# Patient Record
Sex: Female | Born: 1970 | Race: White | Hispanic: No | Marital: Single | Smoking: Current every day smoker
Health system: Southern US, Community
[De-identification: ages and names within clinical notes are randomized; demographics above are authoritative.]

## PROBLEM LIST (undated history)

## (undated) DIAGNOSIS — E7800 Pure hypercholesterolemia, unspecified: Secondary | ICD-10-CM

## (undated) DIAGNOSIS — I10 Essential (primary) hypertension: Secondary | ICD-10-CM

## (undated) DIAGNOSIS — F419 Anxiety disorder, unspecified: Secondary | ICD-10-CM

## (undated) HISTORY — DX: Essential (primary) hypertension: I10

## (undated) HISTORY — DX: Anxiety disorder, unspecified: F41.9

## (undated) HISTORY — DX: Pure hypercholesterolemia, unspecified: E78.00

## (undated) HISTORY — PX: NO PAST SURGERIES: SHX2092

---

## 2006-09-22 DIAGNOSIS — J45909 Unspecified asthma, uncomplicated: Secondary | ICD-10-CM | POA: Insufficient documentation

## 2008-05-08 DIAGNOSIS — E785 Hyperlipidemia, unspecified: Secondary | ICD-10-CM | POA: Insufficient documentation

## 2010-12-23 ENCOUNTER — Ambulatory Visit: Payer: Self-pay | Admitting: Internal Medicine

## 2011-10-13 DIAGNOSIS — F17200 Nicotine dependence, unspecified, uncomplicated: Secondary | ICD-10-CM | POA: Insufficient documentation

## 2014-05-23 ENCOUNTER — Other Ambulatory Visit: Payer: Self-pay | Admitting: Endocrinology

## 2014-06-15 ENCOUNTER — Ambulatory Visit: Payer: Self-pay

## 2014-12-30 ENCOUNTER — Ambulatory Visit
Admit: 2014-12-30 | Disposition: A | Discharge status: Home / Self Care | Payer: Self-pay | Attending: Hematology and Oncology | Admitting: Hematology and Oncology

## 2015-01-06 ENCOUNTER — Telehealth: Payer: Self-pay | Admitting: Hematology and Oncology

## 2015-01-06 ENCOUNTER — Ambulatory Visit
Admit: 2015-01-06 | Disposition: A | Discharge status: Home / Self Care | Payer: Self-pay | Attending: Hematology and Oncology | Admitting: Hematology and Oncology

## 2015-01-06 LAB — CBC CANCER CENTER
Basophil #: 0.1 x10 3/mm (ref 0.0–0.1)
Basophil %: 1 %
Eosinophil #: 0.3 x10 3/mm (ref 0.0–0.7)
Eosinophil %: 2.8 %
HCT: 41.9 % (ref 35.0–47.0)
HGB: 14 g/dL (ref 12.0–16.0)
Lymphocyte #: 4.2 x10 3/mm — ABNORMAL HIGH (ref 1.0–3.6)
Lymphocyte %: 36.9 %
MCH: 30.3 pg (ref 26.0–34.0)
MCHC: 33.4 g/dL (ref 32.0–36.0)
MCV: 91 fL (ref 80–100)
Monocyte #: 0.6 x10 3/mm (ref 0.2–0.9)
Monocyte %: 5.3 %
Neutrophil #: 6.1 x10 3/mm (ref 1.4–6.5)
Neutrophil %: 54 %
Platelet: 279 x10 3/mm (ref 150–440)
RBC: 4.62 10*6/uL (ref 3.80–5.20)
RDW: 13.8 % (ref 11.5–14.5)
WBC: 11.2 x10 3/mm — ABNORMAL HIGH (ref 3.6–11.0)

## 2015-01-06 LAB — SEDIMENTATION RATE: Erythrocyte Sed Rate: 5 mm/hr (ref 0–20)

## 2015-01-28 NOTE — Telephone Encounter (Signed)
Epic requires text in order to cancel encounter.

## 2015-06-23 ENCOUNTER — Other Ambulatory Visit: Payer: Self-pay | Admitting: *Deleted

## 2015-06-23 ENCOUNTER — Other Ambulatory Visit: Payer: Self-pay | Admitting: Unknown Physician Specialty

## 2015-06-23 ENCOUNTER — Inpatient Hospital Stay
Admission: RE | Admit: 2015-06-23 | Discharge: 2015-06-23 | Disposition: A | Discharge status: Home / Self Care | Payer: Self-pay | Source: Ambulatory Visit | Attending: *Deleted | Admitting: *Deleted

## 2015-06-23 DIAGNOSIS — Z9289 Personal history of other medical treatment: Secondary | ICD-10-CM

## 2015-06-23 DIAGNOSIS — R928 Other abnormal and inconclusive findings on diagnostic imaging of breast: Secondary | ICD-10-CM

## 2015-07-06 ENCOUNTER — Ambulatory Visit
Admission: RE | Admit: 2015-07-06 | Discharge: 2015-07-06 | Disposition: A | Discharge status: Home / Self Care | Payer: PRIVATE HEALTH INSURANCE | Source: Ambulatory Visit | Attending: Unknown Physician Specialty | Admitting: Unknown Physician Specialty

## 2015-07-06 DIAGNOSIS — N6001 Solitary cyst of right breast: Secondary | ICD-10-CM | POA: Diagnosis not present

## 2015-07-06 DIAGNOSIS — R928 Other abnormal and inconclusive findings on diagnostic imaging of breast: Secondary | ICD-10-CM

## 2016-07-03 ENCOUNTER — Other Ambulatory Visit: Payer: Self-pay | Admitting: Obstetrics & Gynecology

## 2016-07-03 DIAGNOSIS — Z1231 Encounter for screening mammogram for malignant neoplasm of breast: Secondary | ICD-10-CM

## 2016-07-31 ENCOUNTER — Ambulatory Visit
Admission: RE | Admit: 2016-07-31 | Discharge: 2016-07-31 | Disposition: A | Discharge status: Home / Self Care | Payer: PRIVATE HEALTH INSURANCE | Source: Ambulatory Visit | Attending: Obstetrics & Gynecology | Admitting: Obstetrics & Gynecology

## 2016-07-31 DIAGNOSIS — Z1231 Encounter for screening mammogram for malignant neoplasm of breast: Secondary | ICD-10-CM | POA: Diagnosis present

## 2017-07-10 ENCOUNTER — Other Ambulatory Visit: Payer: Self-pay | Admitting: Family Medicine

## 2017-08-06 ENCOUNTER — Other Ambulatory Visit: Payer: Self-pay | Admitting: Nurse Practitioner

## 2017-08-06 DIAGNOSIS — Z1231 Encounter for screening mammogram for malignant neoplasm of breast: Secondary | ICD-10-CM

## 2017-08-31 ENCOUNTER — Ambulatory Visit
Admission: RE | Admit: 2017-08-31 | Discharge: 2017-08-31 | Disposition: A | Discharge status: Home / Self Care | Payer: PRIVATE HEALTH INSURANCE | Source: Ambulatory Visit | Attending: Nurse Practitioner | Admitting: Nurse Practitioner

## 2017-08-31 DIAGNOSIS — Z1231 Encounter for screening mammogram for malignant neoplasm of breast: Secondary | ICD-10-CM | POA: Diagnosis not present

## 2017-09-21 ENCOUNTER — Ambulatory Visit (INDEPENDENT_AMBULATORY_CARE_PROVIDER_SITE_OTHER): Payer: PRIVATE HEALTH INSURANCE | Admitting: Obstetrics and Gynecology

## 2017-09-21 ENCOUNTER — Encounter: Payer: Self-pay | Admitting: Obstetrics and Gynecology

## 2017-09-21 ENCOUNTER — Telehealth: Payer: Self-pay | Admitting: Obstetrics and Gynecology

## 2017-09-21 VITALS — BP 122/82 | HR 79 | Ht 70.0 in | Wt 191.0 lb

## 2017-09-21 DIAGNOSIS — Z01419 Encounter for gynecological examination (general) (routine) without abnormal findings: Secondary | ICD-10-CM

## 2017-09-21 DIAGNOSIS — Z1231 Encounter for screening mammogram for malignant neoplasm of breast: Secondary | ICD-10-CM | POA: Diagnosis not present

## 2017-09-21 DIAGNOSIS — Z1239 Encounter for other screening for malignant neoplasm of breast: Secondary | ICD-10-CM

## 2017-09-21 DIAGNOSIS — Z124 Encounter for screening for malignant neoplasm of cervix: Secondary | ICD-10-CM | POA: Diagnosis not present

## 2017-09-21 DIAGNOSIS — Z0000 Encounter for general adult medical examination without abnormal findings: Secondary | ICD-10-CM

## 2017-09-21 NOTE — Telephone Encounter (Signed)
2/15 at 1:50 with schuman for mirena

## 2017-09-21 NOTE — Progress Notes (Signed)
Gynecology Annual Exam  PCP: Fayrene HelperBoswell, Chelsa H, NP  Chief Complaint:  Chief Complaint  Patient presents with  . Gynecologic Exam    History of Present Illness: Patient is a 47 y.o. G3P0030 presents for annual exam. The patient has no complaints today.   LMP: No LMP recorded. Patient is not currently having periods (Reason: IUD). Average Interval: unknown Duration of flow: 0 days Heavy Menses: no Clots: no Intermenstrual Bleeding: no Postcoital Bleeding: no Dysmenorrhea: no   The patient is sexually active. She currently uses IUD for contraception. She denies dyspareunia.  The patient does not perform self breast exams.  There is no notable family history of breast or ovarian cancer in her family.  The patient wears seatbelts: yes.   The patient has regular exercise: not asked.    The patient denies current symptoms of depression.    Review of Systems: Review of Systems  Constitutional: Negative for chills, fever, malaise/fatigue and weight loss.  HENT: Negative for congestion, hearing loss and sinus pain.   Eyes: Negative for blurred vision and double vision.  Respiratory: Negative for cough, sputum production, shortness of breath and wheezing.   Cardiovascular: Negative for chest pain, palpitations, orthopnea and leg swelling.  Gastrointestinal: Negative for abdominal pain, constipation, diarrhea, nausea and vomiting.  Genitourinary: Negative for dysuria, flank pain, frequency, hematuria and urgency.  Musculoskeletal: Negative for back pain, falls and joint pain.  Skin: Negative for itching and rash.  Neurological: Negative for dizziness and headaches.  Psychiatric/Behavioral: Negative for depression, substance abuse and suicidal ideas. The patient is not nervous/anxious.     Past Medical History:  Past Medical History:  Diagnosis Date  . Anxiety   . Hypercholesterolemia   . Hypertension     Past Surgical History:  Past Surgical History:  Procedure Laterality  Date  . NO PAST SURGERIES      Gynecologic History:  No LMP recorded. Patient is not currently having periods (Reason: IUD). Contraception: IUD Last Pap: Results were: NIL 2016  Last mammogram: December 2018 Results were: [facility] Obstetric History: G3P0030  Family History:  Family History  Problem Relation Age of Onset  . Lung cancer Father [id number]  . Hypertension Mother   . Heart disease Maternal Grandmother     Social History:  Social History   Socioeconomic History  . Marital status: Single    Spouse name: Not on file  . Number of children: Not on file  . Years of education: Not on file  . Highest education level: Not on file  Social Needs  . Financial resource strain: Not on file  . Food insecurity - worry: Not on file  . Food insecurity - inability: Not on file  . Transportation needs - medical: Not on file  . Transportation needs - non-medical: Not on file  Occupational History  . Not on file  Tobacco Use  . Smoking status: Current Every Day Smoker    Packs/day: 1.00    Types: Cigarettes  . Smokeless tobacco: Never Used  Substance and Sexual Activity  . Alcohol use: Yes  . Drug use: No  . Sexual activity: Yes    Birth control/protection: IUD  Other Topics Concern  . Not on file  Social History Narrative  . Not on file    Allergies:  Allergies  Allergen Reactions  . Erythromycin Other (See Comments)    As a child, unsure reaction     Medications: Prior to Admission medications   Medication Sig Start Date  End Date Taking? Authorizing Provider  ALPRAZolam (XANAX) 0.25 MG tablet TK 1 T PO ONCE D PRA 09/17/17  Yes [provider]  levonorgestrel (MIRENA) 20 MCG/24HR IUD 1 each by Intrauterine route once.   Yes [provider]  lisinopril (PRINIVIL,ZESTRIL) 10 MG tablet TK 1 T PO ONCE A DAY 08/06/17  Yes [provider]  PROAIR HFA 108 (90 Base) MCG/ACT inhaler INL 1 PUFF PO Q 6 H PRF SOB OR WHZ 08/06/17  Yes [provider]  simvastatin (ZOCOR) 40 MG tablet TK 1 T PO HS 08/06/17  Yes [provider]    Physical Exam Vitals: Blood pressure 122/82, pulse 79, height 5\' 10"  (1.778 m), weight 191 lb (86.6 kg).  Physical Exam  Constitutional: She is oriented to person, place, and time. She appears well-developed.  Genitourinary: Vagina normal and uterus normal. There is no lesion on the right labia. There is no lesion on the left labia. Vagina exhibits no lesion. Right adnexum does not display mass and does not display fullness. Left adnexum does not display mass and does not display fullness.  Cervix exhibits visible IUD strings. Cervix does not exhibit motion tenderness.  Genitourinary Comments: Exam limited because of body habitus  HENT:  Head: Normocephalic and atraumatic.  Eyes: EOM are normal.  Neck: Neck supple. No thyromegaly present.  Cardiovascular: Normal rate, regular rhythm and normal heart sounds.  Pulmonary/Chest: Effort normal and breath sounds normal. Right breast exhibits no inverted nipple, no mass, no nipple discharge and no skin change. Left breast exhibits no inverted nipple, no mass, no nipple discharge and no skin change.  Abdominal: Soft. Bowel sounds are normal. She exhibits no distension and no mass.  Neurological: She is alert and oriented to person, place, and time.  Skin: Skin is warm and dry.  Psychiatric: She has a normal mood and affect. Her behavior is normal. Judgment and thought content normal.  Vitals reviewed.    Female chaperone present for pelvic and breast  portions of the physical exam  Assessment: 47 y.o. G3P0030 routine annual exam  Plan: Problem List Items Addressed This Visit    None    Visit Diagnoses    Screening for breast cancer    -  Primary   Relevant Orders   MM DIGITAL SCREENING BILATERAL   Screening for cervical cancer       Relevant Orders   PapIG, HPV, rfx 16/18   Health care maintenance       Relevant Orders   MM DIGITAL SCREENING  BILATERAL   PapIG, HPV, rfx 16/18      1) Mammogram - recommend yearly screening mammogram.  Mammogram Was ordered today  2) STI screening was offered and declined  3) ASCCP guidelines and rational discussed.  Patient opts for every 3 years screening interval  4) Contraception - Education given regarding options for contraception. Will have patient return for IUD replacement in 1 month. IUD inserted November 2018  5) Colonoscopy -- Screening recommended starting at age 72 for average risk individuals, age 60 for individuals deemed at increased risk (including African Americans) and recommended to continue until age 66.  For patient age 59-85 individualized approach is recommended.  Gold standard screening is via colonoscopy, Cologuard screening is an acceptable alternative for patient unwilling or unable to undergo colonoscopy.  "Colorectal cancer screening for average?risk adults: 2018 guideline update from the American Cancer Society"CA: A Cancer Journal for Clinicians: Feb 07, 2017 . Discussed with patient. She desires to start  screening at 50.  6) Routine healthcare maintenance including cholesterol, diabetes screening discussed managed by PCP  7) Encouraged smoking cessation  Adelene Idler MD  [facility] 09/21/17 2:13 PM

## 2017-09-25 LAB — PAPIG, HPV, RFX 16/18
HPV, high-risk: NEGATIVE
PAP Smear Comment: 0

## 2017-09-25 NOTE — Progress Notes (Signed)
NIL HPV negative, repeat pap in 5 years, called and left message to check mychart.

## 2017-10-26 ENCOUNTER — Ambulatory Visit (INDEPENDENT_AMBULATORY_CARE_PROVIDER_SITE_OTHER): Payer: PRIVATE HEALTH INSURANCE | Admitting: Obstetrics and Gynecology

## 2017-10-26 ENCOUNTER — Encounter: Payer: Self-pay | Admitting: Obstetrics and Gynecology

## 2017-10-26 VITALS — BP 140/90 | HR 82 | Ht 70.0 in | Wt 189.0 lb

## 2017-10-26 DIAGNOSIS — Z30431 Encounter for routine checking of intrauterine contraceptive device: Secondary | ICD-10-CM

## 2017-10-26 DIAGNOSIS — Z30433 Encounter for removal and reinsertion of intrauterine contraceptive device: Secondary | ICD-10-CM | POA: Diagnosis not present

## 2017-10-26 NOTE — Progress Notes (Signed)
  History of Present Illness:  Sara Cummings is a 47 y.o. that had a Mirena IUD placed approximately 5 years ago. Since that time, she states that she has been happy with this form of contraception and had no bleeding.  The following portions of the patient's history were reviewed and updated as appropriate: allergies, current medications, past family history, past medical history, past social history, past surgical history and problem list.  There are no active problems to display for this patient.  Medications:  Current Outpatient Medications on File Prior to Visit  Medication Sig Dispense Refill  . ALPRAZolam (XANAX) 0.25 MG tablet TK 1 T PO ONCE D PRA  2  . levonorgestrel (MIRENA) 20 MCG/24HR IUD 1 each by Intrauterine route once.    Marland Kitchen. lisinopril (PRINIVIL,ZESTRIL) 10 MG tablet TK 1 T PO ONCE A DAY  1  . PROAIR HFA 108 (90 Base) MCG/ACT inhaler INL 1 PUFF PO Q 6 H PRF SOB OR WHZ  1  . simvastatin (ZOCOR) 40 MG tablet TK 1 T PO HS  1   No current facility-administered medications on file prior to visit.    Allergies: is allergic to erythromycin.  Physical Exam:  BP 140/90   Pulse 82   Ht 5\' 10"  (1.778 m)   Wt 189 lb (85.7 kg)   BMI 27.12 kg/m  Body mass index is 27.12 kg/m. Constitutional: Well nourished, well developed female in no acute distress.  Abdomen: diffusely non tender to palpation, non distended, and no masses, hernias Neuro: Grossly intact Psych:  Normal mood and affect.    Pelvic exam:  Two IUD strings present seen coming from the cervical os. EGBUS, vaginal vault and cervix: within normal limits  IUD Removal Strings of IUD identified and grasped.  IUD removed without problem.  Pt tolerated this well.  IUD noted to be intact.  IUD PROCEDURE NOTE:  Sara Cummings is a 47 y.o. G3P0030 here for IUD insertion. No GYN concerns.  Last pap smear was normal.  IUD Insertion Procedure Note Patient identified, informed consent performed, consent signed.   Discussed  risks of irregular bleeding, cramping, infection, malpositioning or misplacement of the IUD outside the uterus which may require further procedure such as laparoscopy, risk of failure <1%. Time out was performed.  Urine pregnancy test negative.  A bimanual exam showed the uterus to be anteverted.  Speculum placed in the vagina.  Cervix visualized.  Cleaned with Betadine x 2.  Grasped anteriorly with a single tooth tenaculum.  Uterus sounded to 6 cm.   IUD placed per manufacturer's recommendations.  Strings trimmed to 3 cm. Tenaculum was removed, good hemostasis noted.  Patient tolerated procedure well.   Patient was given post-procedure instructions.  She was advised to have backup contraception for one week.  Patient was also asked to check IUD strings periodically and follow up in 4 weeks for IUD check.   Adelene Idlerhristanna Schuman MD [facility], [facility]. 10/26/17 2:18 PM

## 2017-11-06 NOTE — Telephone Encounter (Signed)
Mirena received 10/22/17

## 2018-07-19 ENCOUNTER — Other Ambulatory Visit: Payer: Self-pay | Admitting: Nurse Practitioner

## 2018-07-19 DIAGNOSIS — Z1231 Encounter for screening mammogram for malignant neoplasm of breast: Secondary | ICD-10-CM

## 2018-09-27 ENCOUNTER — Ambulatory Visit
Admission: RE | Admit: 2018-09-27 | Discharge: 2018-09-27 | Disposition: A | Discharge status: Home / Self Care | Payer: PRIVATE HEALTH INSURANCE | Source: Ambulatory Visit | Attending: Nurse Practitioner | Admitting: Nurse Practitioner

## 2018-09-27 DIAGNOSIS — Z1231 Encounter for screening mammogram for malignant neoplasm of breast: Secondary | ICD-10-CM | POA: Insufficient documentation

## 2018-10-02 ENCOUNTER — Other Ambulatory Visit: Payer: Self-pay | Admitting: Nurse Practitioner

## 2018-10-02 DIAGNOSIS — R928 Other abnormal and inconclusive findings on diagnostic imaging of breast: Secondary | ICD-10-CM

## 2018-10-02 DIAGNOSIS — R921 Mammographic calcification found on diagnostic imaging of breast: Secondary | ICD-10-CM

## 2018-10-07 ENCOUNTER — Ambulatory Visit
Admission: RE | Admit: 2018-10-07 | Discharge: 2018-10-07 | Disposition: A | Discharge status: Home / Self Care | Payer: PRIVATE HEALTH INSURANCE | Source: Ambulatory Visit | Attending: Nurse Practitioner | Admitting: Nurse Practitioner

## 2018-10-07 DIAGNOSIS — R921 Mammographic calcification found on diagnostic imaging of breast: Secondary | ICD-10-CM | POA: Diagnosis present

## 2018-10-07 DIAGNOSIS — R928 Other abnormal and inconclusive findings on diagnostic imaging of breast: Secondary | ICD-10-CM | POA: Diagnosis not present

## 2018-10-08 ENCOUNTER — Other Ambulatory Visit: Payer: Self-pay | Admitting: Nurse Practitioner

## 2018-10-08 DIAGNOSIS — R928 Other abnormal and inconclusive findings on diagnostic imaging of breast: Secondary | ICD-10-CM

## 2018-10-08 DIAGNOSIS — R921 Mammographic calcification found on diagnostic imaging of breast: Secondary | ICD-10-CM

## 2018-10-09 ENCOUNTER — Ambulatory Visit
Admission: RE | Admit: 2018-10-09 | Discharge: 2018-10-09 | Disposition: A | Discharge status: Home / Self Care | Payer: PRIVATE HEALTH INSURANCE | Source: Ambulatory Visit | Attending: Nurse Practitioner | Admitting: Nurse Practitioner

## 2018-10-09 DIAGNOSIS — R921 Mammographic calcification found on diagnostic imaging of breast: Secondary | ICD-10-CM

## 2018-10-09 DIAGNOSIS — R928 Other abnormal and inconclusive findings on diagnostic imaging of breast: Secondary | ICD-10-CM | POA: Insufficient documentation

## 2018-10-09 HISTORY — PX: BREAST BIOPSY: SHX20

## 2018-10-10 LAB — SURGICAL PATHOLOGY

## 2019-04-21 ENCOUNTER — Other Ambulatory Visit: Payer: Self-pay

## 2019-04-21 DIAGNOSIS — Z20822 Contact with and (suspected) exposure to covid-19: Secondary | ICD-10-CM

## 2019-04-22 LAB — NOVEL CORONAVIRUS, NAA: SARS-CoV-2, NAA: NOT DETECTED

## 2019-08-04 ENCOUNTER — Other Ambulatory Visit: Payer: Self-pay

## 2019-08-04 DIAGNOSIS — Z20822 Contact with and (suspected) exposure to covid-19: Secondary | ICD-10-CM

## 2019-08-06 LAB — NOVEL CORONAVIRUS, NAA: SARS-CoV-2, NAA: NOT DETECTED

## 2019-09-29 ENCOUNTER — Other Ambulatory Visit: Payer: Self-pay | Admitting: Nurse Practitioner

## 2019-09-29 DIAGNOSIS — Z1231 Encounter for screening mammogram for malignant neoplasm of breast: Secondary | ICD-10-CM

## 2020-11-08 IMAGING — MG MM CLIP PLACEMENT
4 series · 4 of 4 positions shown · non-contrast
Comparison: Previous exam(s).

CLINICAL DATA: Post stereotactic core needle biopsy of left breast
lower outer quadrant calcifications.

EXAM:
DIAGNOSTIC LEFT MAMMOGRAM POST STEREOTACTIC BIOPSY

[L XCCL (1 of 3)]
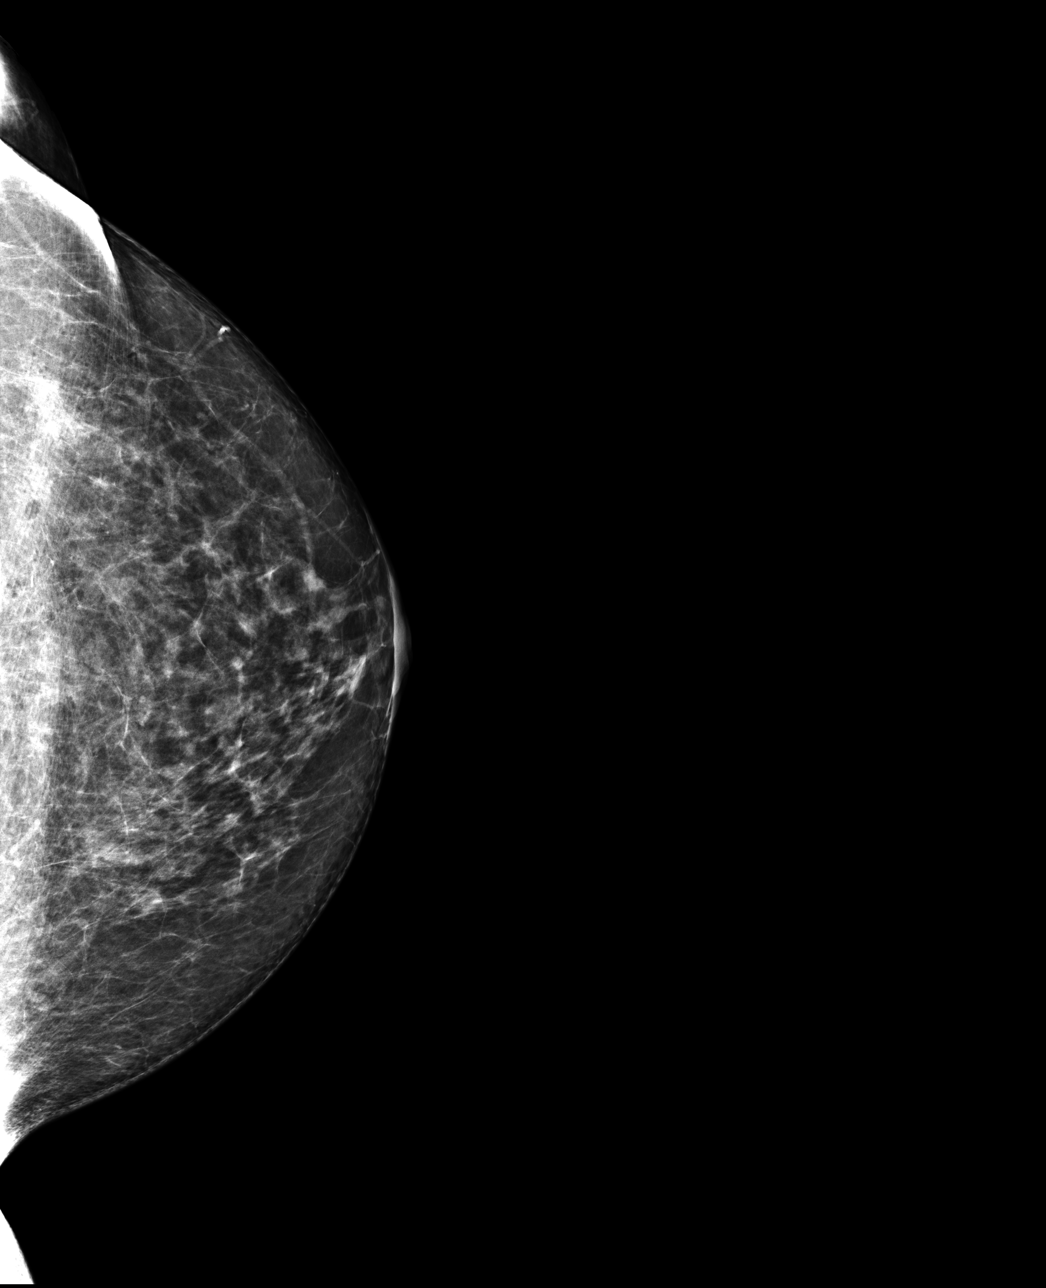

[L XCCL (2 of 3)]
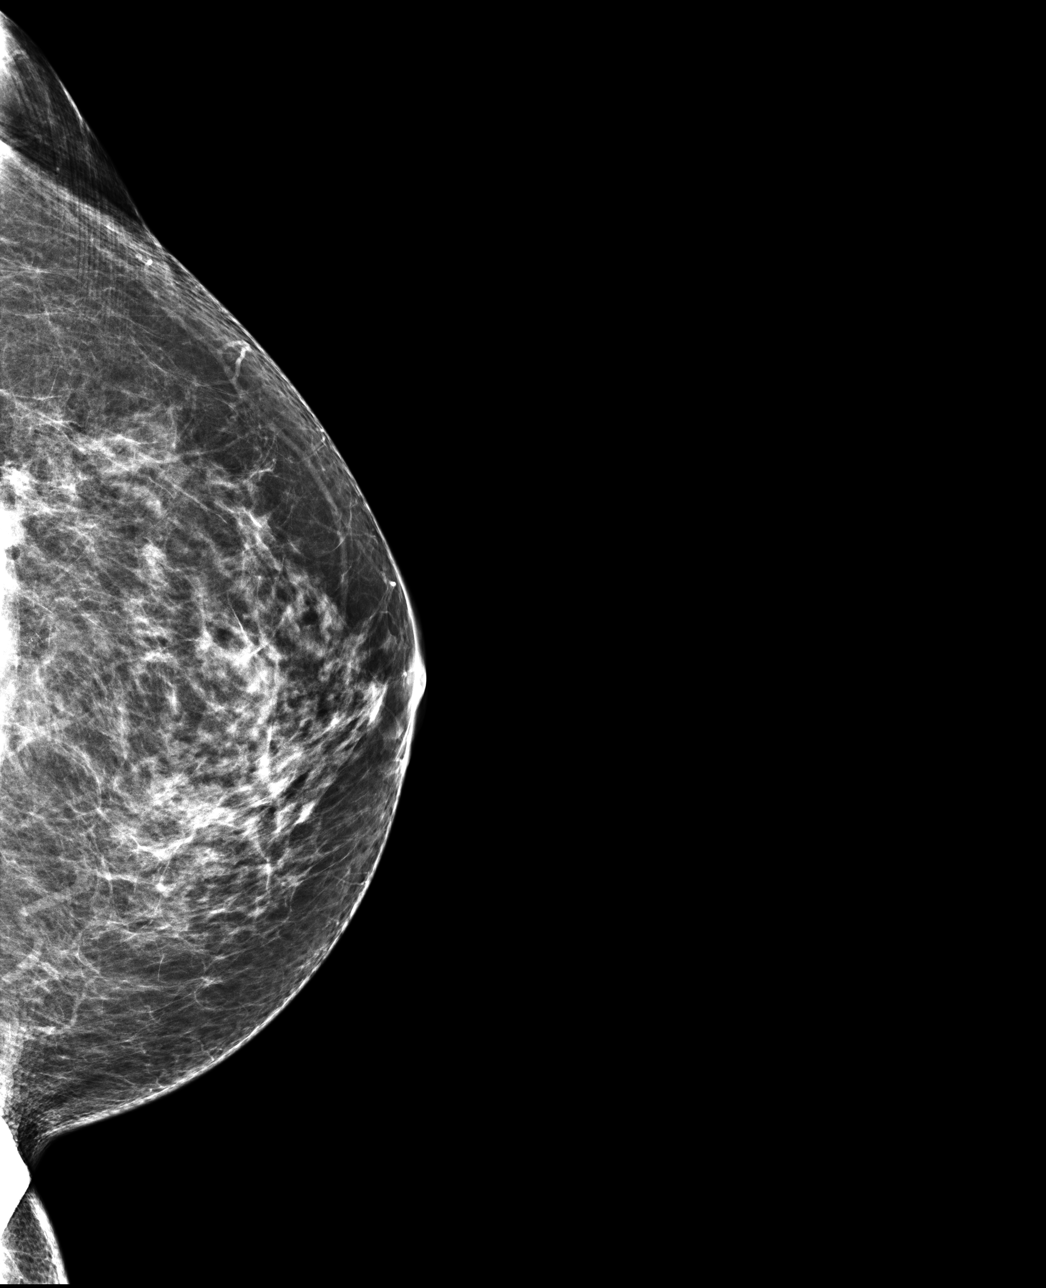

[L LM]
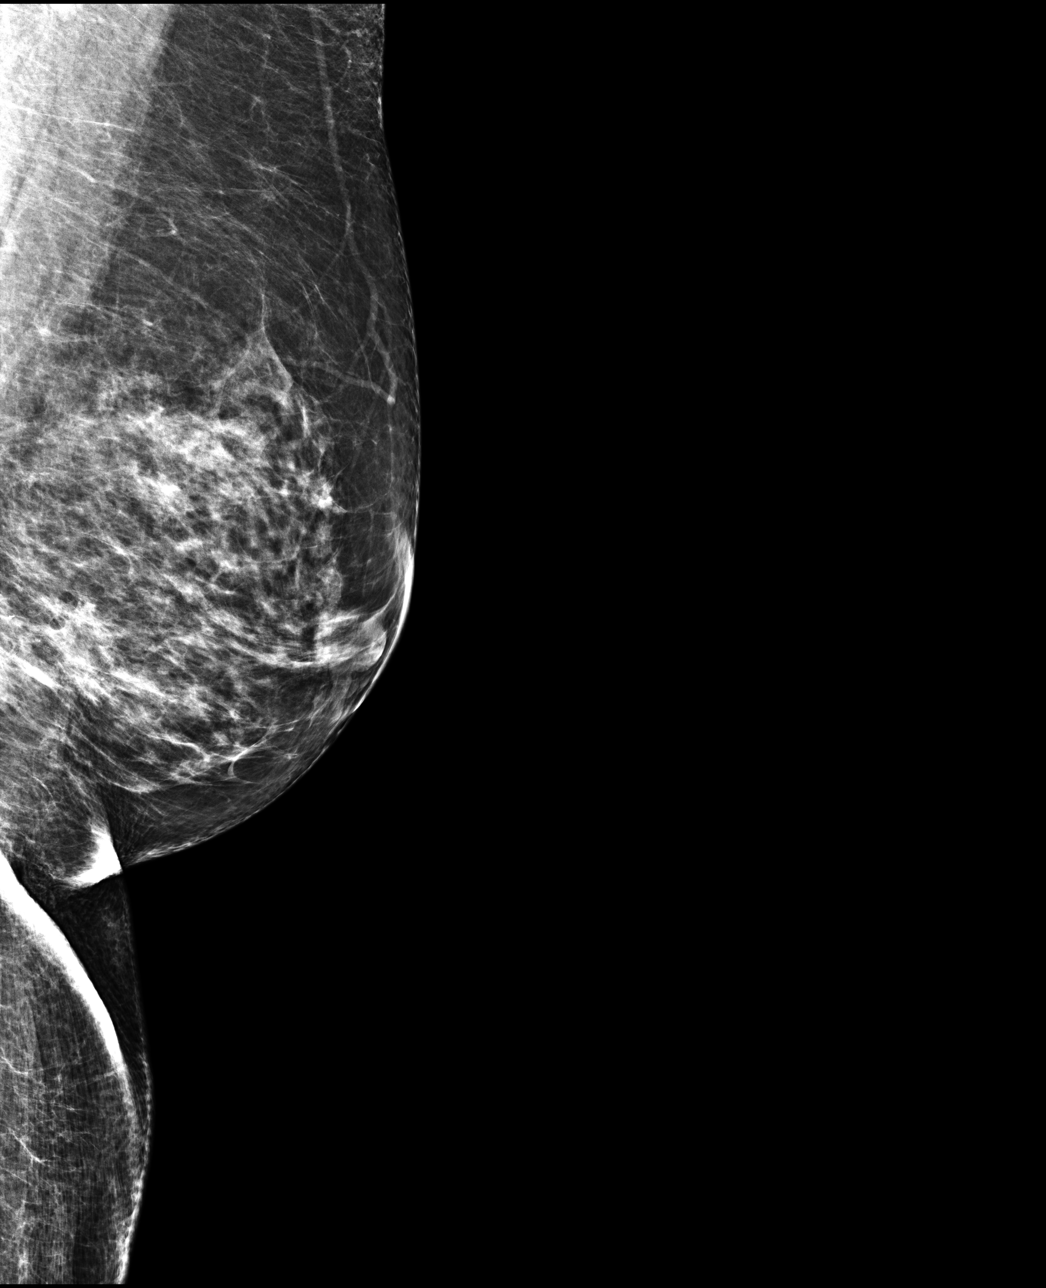

[L XCCL (3 of 3)]
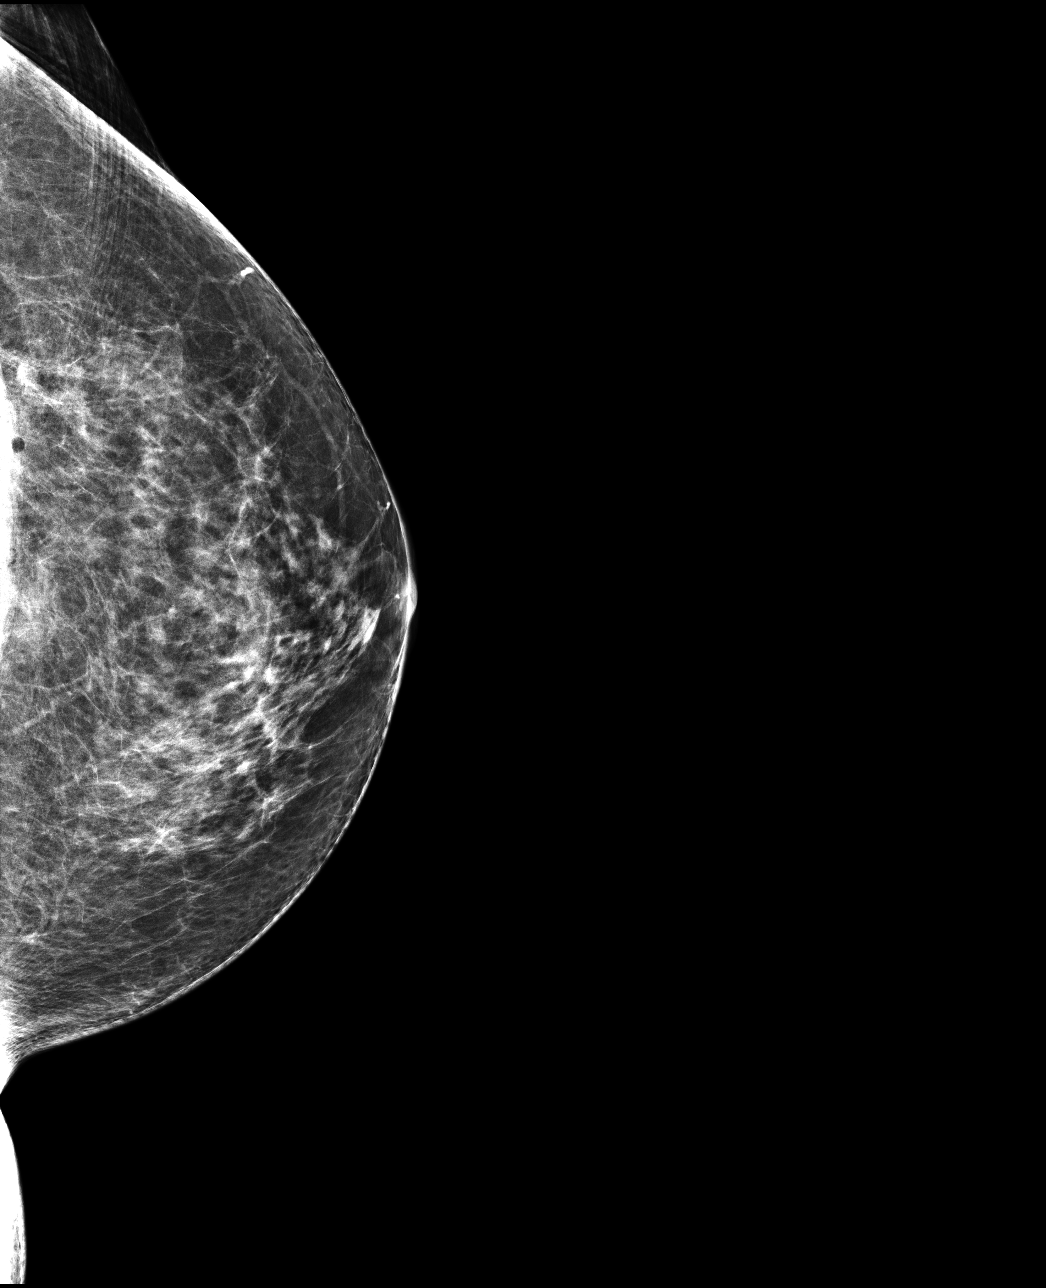

[4 of 4 positions shown; findings below may reference images not displayed]

FINDINGS: Mammographic images were obtained following stereotactic guided
biopsy of left breast calcifications. Two-view mammography
demonstrates presence of X shaped marker in the left breast lower
outer quadrant, in appropriate mammographic position.
IMPRESSION: Successful placement of X shaped marker, post stereotactic core
needle biopsy of the left breast.

Final Assessment: Post Procedure Mammograms for Marker Placement

## 2022-05-24 DIAGNOSIS — Z96641 Presence of right artificial hip joint: Secondary | ICD-10-CM | POA: Insufficient documentation

## 2022-11-02 ENCOUNTER — Ambulatory Visit: Payer: Self-pay | Admitting: *Deleted

## 2022-11-02 NOTE — Telephone Encounter (Signed)
Reason for Disposition  [id number] Systolic BP  >= [id number] OR Diastolic >= 80 AND [id number] taking BP medications    No interventions as she hung up on me.  Answer Assessment - Initial Assessment Questions 1. BLOOD PRESSURE: "What is the blood pressure?" "Did you take at least two measurements 5 minutes apart?"     For the last month I had headaches in middle of forehead above my nose.  I took lisinopril 40 mg.       I smoke.    I'm getting a headache .  138/113 yesterday,  140/100 earlier today, now 154/105  I don't want to have a stroke.   I've taken Goody Powders this morning too.      Nothing is swollen except my neck.   No leg swelling or hand swelling.     I go to [facility] for my PCP.        My BP is concerning me.    "I pulled up urgent care in [place] and got [place]"   I let her know we were not an urgent care but a primary care practice but if she would like I could schedule her for a new pt. Visit to get her established. 154/105 is my BP.    I'm just concerned.   I let her know that we weren't able to treat her without getting her established that we are not an urgent care.    I apologized that our name came up as an urgent care when she did an on line search for urgent cares in [place].   I again let her know I would be glad to set up an appt. But we were not able to do anything for her elevated BP today.    She had called her PCP "but they haven't called me back".  She hung up.    2. ONSET: "When did you take your blood pressure?"     *No Answer* 3. HOW: "How did you take your blood pressure?" (e.g., automatic home BP monitor, visiting nurse)     *No Answer* 4. HISTORY: "Do you have a history of high blood pressure?"     *No Answer* 5. MEDICINES: "Are you taking any medicines for blood pressure?" "Have you missed any doses recently?"     *No Answer* 6. OTHER SYMPTOMS: "Do you have any symptoms?" (e.g., blurred vision, chest pain, difficulty breathing, headache, weakness)      *No Answer* 7. PREGNANCY: "Is there any chance you are pregnant?" "When was your last menstrual period?"     *No Answer*  Protocols used: Blood Pressure - High-A-AH

## 2022-11-02 NOTE — Telephone Encounter (Signed)
  Chief Complaint: Concerned about elevated BP.    She had called her PCP but they had not returned her call.   She was looking for an urgent care and an on line search indicated [facility] was an urgent care.  She is not an established pt with [facility] Symptoms: Headaches Frequency: For the last month    Taking Lisinopril 40 mg. Pertinent Negatives: Patient denies Unable to determine. Disposition: []$ ED /[]$ Urgent Care (no appt availability in office) / [x]$ Appointment(In office/virtual)/ []$  [facility]/ []$ Home Care/ []$ Refused Recommended Disposition /[]$ [place] [facility]/ []$  Follow-up with PCP Additional Notes: She hung up shortly after I let her know that [facility] was not an urgent care.    She was talking fast and loud and saying she was concerned about her BP and didn't want to have a stroke.    She hung up before I could give her any advice or direction.   I did let her know to get in contact with her PCP since that was who was managing her BP when she first told me that she thought she was calling an urgent care center.

## 2022-11-03 ENCOUNTER — Telehealth: Payer: Self-pay

## 2022-11-03 NOTE — Telephone Encounter (Signed)
Sent message via portal

## 2022-11-03 NOTE — Telephone Encounter (Signed)
Patient LM asking for you to call her back states she only wanted to speak with you.

## 2022-11-20 ENCOUNTER — Other Ambulatory Visit: Payer: Self-pay

## 2022-11-20 DIAGNOSIS — E785 Hyperlipidemia, unspecified: Secondary | ICD-10-CM

## 2022-11-20 DIAGNOSIS — I10 Essential (primary) hypertension: Secondary | ICD-10-CM

## 2022-11-20 DIAGNOSIS — R7301 Impaired fasting glucose: Secondary | ICD-10-CM

## 2022-11-21 LAB — CBC WITH DIFFERENTIAL
Basophils Absolute: 0.1 10*3/uL (ref 0.0–0.2)
Basos: 1 %
EOS (ABSOLUTE): 0.3 10*3/uL (ref 0.0–0.4)
Eos: 3 %
Hematocrit: 41.6 % (ref 34.0–46.6)
Hemoglobin: 14.5 g/dL (ref 11.1–15.9)
Immature Grans (Abs): 0 10*3/uL (ref 0.0–0.1)
Immature Granulocytes: 0 %
Lymphocytes Absolute: 3.6 10*3/uL — ABNORMAL HIGH (ref 0.7–3.1)
Lymphs: 33 %
MCH: 36.7 pg — ABNORMAL HIGH (ref 26.6–33.0)
MCHC: 34.9 g/dL (ref 31.5–35.7)
MCV: 105 fL — ABNORMAL HIGH (ref 79–97)
Monocytes Absolute: 0.5 10*3/uL (ref 0.1–0.9)
Monocytes: 4 %
Neutrophils Absolute: 6.5 10*3/uL (ref 1.4–7.0)
Neutrophils: 59 %
RBC: 3.95 x10E6/uL (ref 3.77–5.28)
RDW: 13.5 % (ref 11.7–15.4)
WBC: 10.9 10*3/uL — ABNORMAL HIGH (ref 3.4–10.8)

## 2022-11-21 LAB — LIPID PANEL
Chol/HDL Ratio: 2.8 ratio (ref 0.0–4.4)
Cholesterol, Total: 175 mg/dL (ref 100–199)
HDL: 63 mg/dL (ref >39)
LDL Chol Calc (NIH): 95 mg/dL (ref 0–99)
Triglycerides: 92 mg/dL (ref 0–149)
VLDL Cholesterol Cal: 17 mg/dL (ref 5–40)

## 2022-11-21 LAB — CMP14+EGFR
ALT: 56 IU/L — ABNORMAL HIGH (ref 0–32)
AST: 106 IU/L — ABNORMAL HIGH (ref 0–40)
Albumin/Globulin Ratio: 1.5 (ref 1.2–2.2)
Albumin: 4 g/dL (ref 3.8–4.9)
Alkaline Phosphatase: 171 IU/L — ABNORMAL HIGH (ref 44–121)
BUN/Creatinine Ratio: 13 (ref 9–23)
BUN: 8 mg/dL (ref 6–24)
Bilirubin Total: 0.6 mg/dL (ref 0.0–1.2)
CO2: 22 mmol/L (ref 20–29)
Calcium: 9.5 mg/dL (ref 8.7–10.2)
Chloride: 101 mmol/L (ref 96–106)
Creatinine, Ser: 0.62 mg/dL (ref 0.57–1.00)
Globulin, Total: 2.6 g/dL (ref 1.5–4.5)
Glucose: 106 mg/dL — ABNORMAL HIGH (ref 70–99)
Potassium: 4 mmol/L (ref 3.5–5.2)
Sodium: 143 mmol/L (ref 134–144)
Total Protein: 6.6 g/dL (ref 6.0–8.5)
eGFR: 106 mL/min/{1.73_m2} (ref >59)

## 2022-11-21 LAB — HEMOGLOBIN A1C
Est. average glucose Bld gHb Est-mCnc: 117 mg/dL
Hgb A1c MFr Bld: 5.7 % — ABNORMAL HIGH (ref 4.8–5.6)

## 2022-11-21 LAB — TSH: TSH: 1.64 u[IU]/mL (ref 0.450–4.500)

## 2022-11-23 ENCOUNTER — Ambulatory Visit: Payer: Self-pay | Admitting: Nurse Practitioner

## 2022-11-23 VITALS — BP 124/92 | HR 101 | Ht 70.0 in | Wt 185.0 lb

## 2022-11-23 DIAGNOSIS — M542 Cervicalgia: Secondary | ICD-10-CM

## 2022-11-23 DIAGNOSIS — R591 Generalized enlarged lymph nodes: Secondary | ICD-10-CM | POA: Diagnosis not present

## 2022-11-23 DIAGNOSIS — I10 Essential (primary) hypertension: Secondary | ICD-10-CM | POA: Diagnosis not present

## 2022-11-23 NOTE — Progress Notes (Signed)
Established Patient Office Visit  Subjective:  Patient ID: Sara Cummings, female    DOB: 08/03/1971  Age: 52 y.o. MRN: [id number]  Chief Complaint  Patient presents with   Acute Visit    Swelling in neck and jaw line    Acute visit, 1 month and a half of edema and swelling at throat region near thyroid, and difficulty swallowing at times.       Past Medical History:  Diagnosis Date   Anxiety    Hypercholesterolemia    Hypertension     Past Surgical History:  Procedure Laterality Date   BREAST BIOPSY Left 10/09/2018   pending path   NO PAST SURGERIES      Social History   Socioeconomic History   Marital status: Single    Spouse name: Not on file   Number of children: Not on file   Years of education: Not on file   Highest education level: Not on file  Occupational History   Not on file  Tobacco Use   Smoking status: Every Day    Packs/day: 1    Types: Cigarettes   Smokeless tobacco: Never  Vaping Use   Vaping Use: Never used  Substance and Sexual Activity   Alcohol use: Yes   Drug use: No   Sexual activity: Yes    Birth control/protection: I.U.D.  Other Topics Concern   Not on file  Social History Narrative   Not on file   Social Determinants of Health   Financial Resource Strain: Not on file  Food Insecurity: Not on file  Transportation Needs: Not on file  Physical Activity: Inactive (09/21/2017)   Exercise Vital Sign    Days of Exercise per Week: 0 days    Minutes of Exercise per Session: 0 min  Stress: No Stress Concern Present (09/21/2017)   [place]    Feeling of Stress : Only a little  Social Connections: Moderately Isolated (09/21/2017)   Social Connection and Isolation Panel [NHANES]    Frequency of Communication with Friends and Family: Three times a week    Frequency of Social Gatherings with Friends and Family: More than three times a week    Attends Religious  Services: Never    Marine scientist or Organizations: No    Attends Archivist Meetings: Never    Marital Status: Never married  Intimate Partner Violence: Not At Risk (09/21/2017)   Humiliation, Afraid, Rape, and Kick questionnaire    Fear of Current or Ex-Partner: No    Emotionally Abused: No    Physically Abused: No    Sexually Abused: No    Family History  Problem Relation Age of Onset   Lung cancer Father 47   Hypertension Mother    Heart disease Maternal Grandmother     Allergies  Allergen Reactions   Erythromycin Other (See Comments)    As a child, unsure reaction     Review of Systems  Constitutional: Negative.   HENT: Negative.    Eyes: Negative.   Respiratory: Negative.    Cardiovascular: Negative.   Gastrointestinal: Negative.   Genitourinary: Negative.   Musculoskeletal:  Positive for joint pain.  Skin: Negative.   Neurological: Negative.   Endo/Heme/Allergies: Negative.   Psychiatric/Behavioral: Negative.         Objective:   BP (!) 124/92   Pulse (!) 101   Ht '5\' 10"'$  (1.778 m)   Wt 185 lb (83.9 kg)  SpO2 98%   BMI 26.54 kg/m   Vitals:   11/23/22 0903  BP: (!) 124/92  Pulse: (!) 101  Height: '5\' 10"'$  (1.778 m)  Weight: 185 lb (83.9 kg)  SpO2: 98%  BMI (Calculated): 26.54    Physical Exam Vitals reviewed.  Constitutional:      Appearance: Normal appearance.  HENT:     Head: Normocephalic.     Nose: Nose normal.  Eyes:     Pupils: Pupils are equal, round, and reactive to light.  Cardiovascular:     Rate and Rhythm: Normal rate and regular rhythm.  Pulmonary:     Effort: Pulmonary effort is normal.     Breath sounds: Normal breath sounds.  Abdominal:     General: Bowel sounds are normal.     Palpations: Abdomen is soft.  Musculoskeletal:     Cervical back: Tenderness present.  Skin:    General: Skin is warm and dry.  Neurological:     Mental Status: She is alert and oriented to person, place, and time.   Psychiatric:        Mood and Affect: Mood normal.        Behavior: Behavior normal.      No results found for any visits on 11/23/22.  Recent Results (from the past 2160 hour(s))  Hemoglobin A1c     Status: Abnormal   Collection Time: 11/20/22  9:42 AM  Result Value Ref Range   Hgb A1c MFr Bld 5.7 (H) 4.8 - 5.6 %    Comment:          Prediabetes: 5.7 - 6.4          Diabetes: >6.4          Glycemic control for adults with diabetes: <7.0    Est. average glucose Bld gHb Est-mCnc 117 mg/dL  TSH     Status: None   Collection Time: 11/20/22  9:42 AM  Result Value Ref Range   TSH 1.640 0.450 - 4.500 uIU/mL  CMP14+EGFR     Status: Abnormal   Collection Time: 11/20/22  9:42 AM  Result Value Ref Range   Glucose 106 (H) 70 - 99 mg/dL   BUN 8 6 - 24 mg/dL   Creatinine, Ser 0.62 0.57 - 1.00 mg/dL   eGFR 106 >59 mL/min/1.73   BUN/Creatinine Ratio 13 9 - 23   Sodium 143 134 - 144 mmol/L   Potassium 4.0 3.5 - 5.2 mmol/L   Chloride 101 96 - 106 mmol/L   CO2 22 20 - 29 mmol/L   Calcium 9.5 8.7 - 10.2 mg/dL   Total Protein 6.6 6.0 - 8.5 g/dL   Albumin 4.0 3.8 - 4.9 g/dL   Globulin, Total 2.6 1.5 - 4.5 g/dL   Albumin/Globulin Ratio 1.5 1.2 - 2.2   Bilirubin Total 0.6 0.0 - 1.2 mg/dL   Alkaline Phosphatase 171 (H) 44 - 121 IU/L   AST 106 (H) 0 - 40 IU/L   ALT 56 (H) 0 - 32 IU/L  Lipid panel     Status: None   Collection Time: 11/20/22  9:42 AM  Result Value Ref Range   Cholesterol, Total 175 100 - 199 mg/dL   Triglycerides 92 0 - 149 mg/dL   HDL 63 >39 mg/dL   VLDL Cholesterol Cal 17 5 - 40 mg/dL   LDL Chol Calc (NIH) 95 0 - 99 mg/dL   Chol/HDL Ratio 2.8 0.0 - 4.4 ratio    Comment:  T. Chol/HDL Ratio                                             Men  Women                               1/2 Avg.Risk  3.4    3.3                                   Avg.Risk  5.0    4.4                                2X Avg.Risk  9.6    7.1                                 3X Avg.Risk 23.4   11.0   CBC With Differential     Status: Abnormal   Collection Time: 11/20/22  9:42 AM  Result Value Ref Range   WBC 10.9 (H) 3.4 - 10.8 x10E3/uL   RBC 3.95 3.77 - 5.28 x10E6/uL   Hemoglobin 14.5 11.1 - 15.9 g/dL   Hematocrit 41.6 34.0 - 46.6 %   MCV 105 (H) 79 - 97 fL   MCH 36.7 (H) 26.6 - 33.0 pg   MCHC 34.9 31.5 - 35.7 g/dL   RDW 13.5 11.7 - 15.4 %   Neutrophils 59 Not Estab. %   Lymphs 33 Not Estab. %   Monocytes 4 Not Estab. %   Eos 3 Not Estab. %   Basos 1 Not Estab. %   Neutrophils Absolute 6.5 1.4 - 7.0 x10E3/uL   Lymphocytes Absolute 3.6 (H) 0.7 - 3.1 x10E3/uL   Monocytes Absolute 0.5 0.1 - 0.9 x10E3/uL   EOS (ABSOLUTE) 0.3 0.0 - 0.4 x10E3/uL   Basophils Absolute 0.1 0.0 - 0.2 x10E3/uL   Immature Granulocytes 0 Not Estab. %   Immature Grans (Abs) 0.0 0.0 - 0.1 x10E3/uL      Assessment & Plan:   Problem List Items Addressed This Visit       Cardiovascular and Mediastinum   Essential hypertension, benign   Relevant Medications   rosuvastatin (CRESTOR) 20 MG tablet     Other   Cervicalgia - Primary    No follow-ups on file.   Total time spent: 35 minutes  Evern Bio, NP  11/23/2022

## 2022-11-23 NOTE — Patient Instructions (Signed)
1) CT neck - edema for further eval 2) Will follow up with patient after results

## 2022-11-23 NOTE — Addendum Note (Signed)
Addended by: Evern Bio on: 11/23/2022 09:22 AM   Modules accepted: Orders

## 2023-01-12 ENCOUNTER — Ambulatory Visit: Payer: Self-pay | Admitting: Family Medicine

## 2023-02-24 ENCOUNTER — Other Ambulatory Visit: Payer: Self-pay | Admitting: Nurse Practitioner

## 2023-02-27 ENCOUNTER — Other Ambulatory Visit: Payer: Self-pay

## 2023-03-08 ENCOUNTER — Ambulatory Visit: Payer: Self-pay | Admitting: Family Medicine

## 2023-03-12 ENCOUNTER — Other Ambulatory Visit: Payer: Self-pay | Admitting: Nurse Practitioner

## 2023-03-13 ENCOUNTER — Encounter: Payer: Self-pay | Admitting: Emergency Medicine

## 2023-03-13 ENCOUNTER — Emergency Department: Payer: BLUE CROSS/BLUE SHIELD

## 2023-03-13 ENCOUNTER — Ambulatory Visit (INDEPENDENT_AMBULATORY_CARE_PROVIDER_SITE_OTHER): Payer: BLUE CROSS/BLUE SHIELD

## 2023-03-13 ENCOUNTER — Encounter: Payer: Self-pay | Admitting: *Deleted

## 2023-03-13 ENCOUNTER — Ambulatory Visit (INDEPENDENT_AMBULATORY_CARE_PROVIDER_SITE_OTHER)
Admission: EM | Admit: 2023-03-13 | Discharge: 2023-03-13 | Disposition: A | Discharge status: Other Acute Inpatient Hospital | Payer: BLUE CROSS/BLUE SHIELD | Source: Home / Self Care

## 2023-03-13 ENCOUNTER — Emergency Department
Admission: EM | Admit: 2023-03-13 | Discharge: 2023-03-13 | Disposition: A | Discharge status: Home / Self Care | Payer: BLUE CROSS/BLUE SHIELD | Attending: Emergency Medicine | Admitting: Emergency Medicine

## 2023-03-13 ENCOUNTER — Other Ambulatory Visit: Payer: Self-pay

## 2023-03-13 DIAGNOSIS — R0781 Pleurodynia: Secondary | ICD-10-CM | POA: Insufficient documentation

## 2023-03-13 DIAGNOSIS — I10 Essential (primary) hypertension: Secondary | ICD-10-CM | POA: Insufficient documentation

## 2023-03-13 DIAGNOSIS — J189 Pneumonia, unspecified organism: Secondary | ICD-10-CM

## 2023-03-13 DIAGNOSIS — D72825 Bandemia: Secondary | ICD-10-CM | POA: Insufficient documentation

## 2023-03-13 DIAGNOSIS — R109 Unspecified abdominal pain: Secondary | ICD-10-CM | POA: Insufficient documentation

## 2023-03-13 LAB — COMPREHENSIVE METABOLIC PANEL
ALT: 23 U/L (ref 0–44)
AST: 30 U/L (ref 15–41)
Albumin: 3.7 g/dL (ref 3.5–5.0)
Alkaline Phosphatase: 111 U/L (ref 38–126)
Anion gap: 11 (ref 5–15)
BUN: 12 mg/dL (ref 6–20)
CO2: 21 mmol/L — ABNORMAL LOW (ref 22–32)
Calcium: 8.7 mg/dL — ABNORMAL LOW (ref 8.9–10.3)
Chloride: 100 mmol/L (ref 98–111)
Creatinine, Ser: 0.57 mg/dL (ref 0.44–1.00)
GFR, Estimated: >60 mL/min (ref >60)
Glucose, Bld: 125 mg/dL — ABNORMAL HIGH (ref 70–99)
Potassium: 3.6 mmol/L (ref 3.5–5.1)
Sodium: 132 mmol/L — ABNORMAL LOW (ref 135–145)
Total Bilirubin: 1.2 mg/dL (ref 0.3–1.2)
Total Protein: 7.5 g/dL (ref 6.5–8.1)

## 2023-03-13 LAB — URINALYSIS, ROUTINE W REFLEX MICROSCOPIC
Bilirubin Urine: NEGATIVE
Glucose, UA: NEGATIVE mg/dL
Glucose, UA: NEGATIVE mg/dL
Hgb urine dipstick: NEGATIVE
Hgb urine dipstick: NEGATIVE
Ketones, ur: 20 mg/dL — AB
Leukocytes,Ua: NEGATIVE
Leukocytes,Ua: NEGATIVE
Nitrite: NEGATIVE
Nitrite: NEGATIVE
Protein, ur: 30 mg/dL — AB
Protein, ur: NEGATIVE mg/dL
Specific Gravity, Urine: >1.03 — ABNORMAL HIGH (ref 1.005–1.030)
Specific Gravity, Urine: 1.026 (ref 1.005–1.030)
pH: 5.5 (ref 5.0–8.0)
pH: 6 (ref 5.0–8.0)

## 2023-03-13 LAB — URINALYSIS, MICROSCOPIC (REFLEX)

## 2023-03-13 LAB — CBC WITH DIFFERENTIAL/PLATELET
Abs Immature Granulocytes: 0.11 10*3/uL — ABNORMAL HIGH (ref 0.00–0.07)
Basophils Absolute: 0.1 10*3/uL (ref 0.0–0.1)
Basophils Relative: 0 %
Eosinophils Absolute: 0.1 10*3/uL (ref 0.0–0.5)
Eosinophils Relative: 1 %
HCT: 39.8 % (ref 36.0–46.0)
Hemoglobin: 13.3 g/dL (ref 12.0–15.0)
Immature Granulocytes: 1 %
Lymphocytes Relative: 17 %
Lymphs Abs: 3.7 10*3/uL (ref 0.7–4.0)
MCH: 33.8 pg (ref 26.0–34.0)
MCHC: 33.4 g/dL (ref 30.0–36.0)
MCV: 101.3 fL — ABNORMAL HIGH (ref 80.0–100.0)
Monocytes Absolute: 1.2 10*3/uL — ABNORMAL HIGH (ref 0.1–1.0)
Monocytes Relative: 6 %
Neutro Abs: 16.6 10*3/uL — ABNORMAL HIGH (ref 1.7–7.7)
Neutrophils Relative %: 75 %
Platelets: 313 10*3/uL (ref 150–400)
RBC: 3.93 MIL/uL (ref 3.87–5.11)
RDW: 14.5 % (ref 11.5–15.5)
WBC: 21.8 10*3/uL — ABNORMAL HIGH (ref 4.0–10.5)
nRBC: 0 % (ref 0.0–0.2)

## 2023-03-13 LAB — LIPASE, BLOOD: Lipase: 28 U/L (ref 11–51)

## 2023-03-13 MED ORDER — DOXYCYCLINE HYCLATE 100 MG PO TABS
100.0000 mg | ORAL_TABLET | Freq: Once | ORAL | Status: AC
  Administered 2023-03-13: 100 mg via ORAL
  Filled 2023-03-13: qty 1

## 2023-03-13 MED ORDER — ONDANSETRON HCL 4 MG/2ML IJ SOLN
4.0000 mg | Freq: Once | INTRAMUSCULAR | Status: AC
  Administered 2023-03-13: 4 mg via INTRAVENOUS
  Filled 2023-03-13: qty 2

## 2023-03-13 MED ORDER — IOHEXOL 300 MG/ML  SOLN
100.0000 mL | Freq: Once | INTRAMUSCULAR | Status: AC | PRN
  Administered 2023-03-13: 100 mL via INTRAVENOUS

## 2023-03-13 MED ORDER — OXYCODONE-ACETAMINOPHEN 5-325 MG PO TABS: 1.0000 | ORAL_TABLET | ORAL | 0 refills | Status: DC | PRN

## 2023-03-13 MED ORDER — KETOROLAC TROMETHAMINE 30 MG/ML IJ SOLN
30.0000 mg | Freq: Once | INTRAMUSCULAR | Status: AC
  Administered 2023-03-13: 30 mg via INTRAMUSCULAR

## 2023-03-13 MED ORDER — SODIUM CHLORIDE 0.9 % IV SOLN: Freq: Once | INTRAVENOUS | Status: AC

## 2023-03-13 MED ORDER — OXYCODONE-ACETAMINOPHEN 5-325 MG PO TABS
1.0000 | ORAL_TABLET | Freq: Once | ORAL | Status: AC
  Administered 2023-03-13: 1 via ORAL
  Filled 2023-03-13: qty 1

## 2023-03-13 MED ORDER — MORPHINE SULFATE (PF) 4 MG/ML IV SOLN
4.0000 mg | Freq: Once | INTRAVENOUS | Status: AC
  Administered 2023-03-13: 4 mg via INTRAVENOUS
  Filled 2023-03-13: qty 1

## 2023-03-13 MED ORDER — DOXYCYCLINE HYCLATE 100 MG PO TABS: 100.0000 mg | ORAL_TABLET | Freq: Two times a day (BID) | ORAL | 0 refills | Status: DC

## 2023-03-13 MED ORDER — KETOROLAC TROMETHAMINE 30 MG/ML IJ SOLN
15.0000 mg | Freq: Once | INTRAMUSCULAR | Status: AC
  Administered 2023-03-13: 15 mg via INTRAVENOUS
  Filled 2023-03-13: qty 1

## 2023-03-13 NOTE — ED Provider Notes (Signed)
[facility] Provider Note    Event Date/Time   First MD Initiated Contact with Patient 03/13/23 1925     (approximate)   History   Flank Pain (R)   HPI  Sara Cummings is a 52 y.o. female with history of hypertension who presents with complaints of right flank pain, went to urgent care prior to arriving to the ED and was told that she had an elevated white blood cell count.  She reports the pain is moderate to severe, received IM Toradol shot which helped somewhat at urgent care.  No history of kidney stones.  No significant dysuria     Physical Exam   Triage Vital Signs: ED Triage Vitals  Enc Vitals Group     BP 03/13/23 1904 (!) 156/86     Pulse Rate 03/13/23 1904 (!) 109     Resp 03/13/23 1904 (!) 21     Temp 03/13/23 1904 98.7 F (37.1 C)     Temp Source 03/13/23 1904 Oral     SpO2 03/13/23 1904 100 %     Weight 03/13/23 1908 83.9 kg (185 lb)     Height 03/13/23 1908 1.778 m (5\' 10" )     Head Circumference --      Peak Flow --      Pain Score 03/13/23 1908 6     Pain Loc --      Pain Edu? --      Excl. in GC? --     Most recent vital signs: Vitals:   03/13/23 1904  BP: (!) 156/86  Pulse: (!) 109  Resp: (!) 21  Temp: 98.7 F (37.1 C)  SpO2: 100%     General: Awake, no distress.  CV:  Good peripheral perfusion.  Tachycardia Resp:  Normal effort.  Abd:  No distention.  Mild right CVA tenderness Other:     ED Results / Procedures / Treatments   Labs (all labs ordered are listed, but only abnormal results are displayed) Labs Reviewed  URINALYSIS, ROUTINE W REFLEX MICROSCOPIC     EKG     RADIOLOGY CT abdomen pelvis pending    PROCEDURES:  Critical Care performed:   Procedures   MEDICATIONS ORDERED IN ED: Medications  morphine (PF) 4 MG/ML injection 4 mg (has no administration in time range)  ondansetron (ZOFRAN) injection 4 mg (has no administration in time range)  0.9 %  sodium chloride infusion (has no  administration in time range)     IMPRESSION / MDM / ASSESSMENT AND PLAN / ED COURSE  I reviewed the triage vital signs and the nursing notes. Patient's presentation is most consistent with acute presentation with potential threat to life or bodily function.  Patient presents with right flank pain as detailed above, suspicious for ureterolithiasis.  Differential also includes pyelonephritis.  She does have mild tachycardia as well as an elevated white blood cell count but denies dysuria.  Will recheck urinalysis here given contaminated sample in urgent care.  Treat with morphine and Zofran, she is not driving  Have asked my colleague to follow-up on CT results and disposition accordingly        FINAL CLINICAL IMPRESSION(S) / ED DIAGNOSES   Final diagnoses:  Flank pain     Rx / DC Orders   ED Discharge Orders     None        Note:  This document was prepared using Dragon voice recognition software and may include unintentional dictation errors.   Saran Laviolette,  Molly Maduro, MD 03/13/23 Serena Croissant

## 2023-03-13 NOTE — ED Provider Notes (Signed)
  Physical Exam  BP (!) 156/86 (BP Location: Left Arm)   Pulse (!) 109   Temp 98.7 F (37.1 C) (Oral)   Resp (!) 21   Ht 5\' 10"  (1.778 m)   Wt 83.9 kg   SpO2 100%   BMI 26.54 kg/m   Physical Exam  Procedures  Procedures  ED Course / MDM    Medical Decision Making Amount and/or Complexity of Data Reviewed Labs: ordered. Radiology: ordered.  Risk Prescription drug management.   Assuming care of the patient from Dr. Cyril Loosen.  In short patient had elevated WBC at the urgent care along with flank pain and tachycardia so they sent her here to the ED.  Patient denies any abdominal pain at this time.  She was given Toradol at the urgent care.  CT of the abdomen/pelvis with IV contrast was independently reviewed and interpreted by me as being in negative for kidney stone or appendicitis.  No pyelonephritis.  UA is normal.  However the radiologist does indicate that there may be some pneumonia in the lower lung fields.  Will do a chest x-ray to assess  Chest x-ray independently reviewed interpreted by me as having a poorly defined infiltrate right lower lobe.  Confirmed by radiology  I did discuss these findings with patient.  She is beginning to have pain in the flank area again.  So I gave her Toradol 15 mg IV.  Percocet p.o.  Doxycycline p.o.  Patient was given a prescription for doxycycline and Percocet at discharge.  Strict instructions to return if worsening.  He was discharged in stable condition.      Faythe Ghee, PA-C 03/13/23 2131    Jene Every, MD 03/16/23 [phone]

## 2023-03-13 NOTE — ED Triage Notes (Signed)
Pt reports back pain that started this morning. Pt reports waking up with back pain. Back pain has increased thru out the day. No injury to cause pain.

## 2023-03-13 NOTE — Discharge Instructions (Signed)
Follow-up with your regular doctor.  Let them know that we diagnosed you with community-acquired pneumonia.  Take the doxycycline as prescribed.  You will need a repeat chest x-ray in about 3 weeks to ensure that the infection cleared.  Return to emergency department if you are worsening.  Take pain medication as needed.

## 2023-03-13 NOTE — ED Notes (Addendum)
Patient is being discharged from the Urgent Care and sent to the Emergency Department via POV . Per Nehemiah Settle PA, patient is in need of higher level of care due to R flank pain. Patient is aware and verbalizes understanding of plan of care.  Vitals:   03/13/23 1715  BP: 125/81  Pulse: 95  Resp: 18  Temp: 99.2 F (37.3 C)  SpO2: 97%

## 2023-03-13 NOTE — ED Provider Notes (Signed)
[facility]    CSN: [id number] Arrival date & time: 03/13/23  1659      History   Chief Complaint Chief Complaint  Patient presents with   Back Pain    HPI Sara Cummings is a 52 y.o. female presenting for right flank pain since earlier today which has gotten worse.  Pain does not radiate.  Pain is worse with lying flat.  She says she cannot get comfortable.  Pain is worse with taking a deep breath.  Denies fever, chills, cough, congestion, chest pain, abdominal pain, nausea/vomiting/diarrhea or constipation.  Denies urinary frequency/urgency, painful urination, hematuria.  No injury to her back.  No history of back problems.  Has taken over-the-counter Tylenol and NSAIDs without much improvement in symptoms.  No history of kidney stones.  No history of PE/DVT. No recent surgery or travel. No other complaints.  HPI  Past Medical History:  Diagnosis Date   Anxiety    Hypercholesterolemia    Hypertension     Patient Active Problem List   Diagnosis Date Noted   Cervicalgia 11/23/2022   Essential hypertension, benign 11/23/2022    Past Surgical History:  Procedure Laterality Date   BREAST BIOPSY Left 10/09/2018   pending path   NO PAST SURGERIES      OB History     Gravida  3   Para      Term      Preterm      AB  3   Living         SAB      IAB  3   Ectopic      Multiple      Live Births               Home Medications    Prior to Admission medications   Medication Sig Start Date End Date Taking? Authorizing Provider  ALPRAZolam (XANAX) 0.25 MG tablet TAKE 1 TABLET BY MOUTH EVERY DAY AS NEEDED FOR ANXIETY. DO NOT TAKE WITH OXYCODONE DUE TO RESPIRATORY DEPRESSION 03/13/23  Yes Miki Kins, FNP  levonorgestrel (MIRENA) 20 MCG/24HR IUD 1 each by Intrauterine route once.   Yes [provider]  lisinopril (ZESTRIL) 10 MG tablet TAKE 1 TABLET BY MOUTH EVERY DAY 02/26/23  Yes Miki Kins, FNP  PROAIR HFA 108 [phone] Base)  MCG/ACT inhaler INL 1 PUFF PO Q 6 H PRF SOB OR WHZ 08/06/17  Yes [provider]  rosuvastatin (CRESTOR) 20 MG tablet Take 20 mg by mouth daily. 04/21/20  Yes [provider]  cyclobenzaprine (FLEXERIL) 5 MG tablet Take 5 mg by mouth 3 (three) times daily as needed.    [provider]  omeprazole (PRILOSEC) 40 MG capsule Take 40 mg by mouth daily.    [provider]  simvastatin (ZOCOR) 40 MG tablet TK 1 T PO HS Patient not taking: Reported on 11/23/2022 08/06/17   [provider]    Family History Family History  Problem Relation Age of Onset   Lung cancer Father 13   Hypertension Mother    Heart disease Maternal Grandmother     Social History Social History   Tobacco Use   Smoking status: Every Day    Packs/day: 1    Types: Cigarettes   Smokeless tobacco: Never  Vaping Use   Vaping Use: Never used  Substance Use Topics   Alcohol use: Yes   Drug use: No     Allergies   Erythromycin   Review  of Systems Review of Systems  Constitutional:  Negative for fatigue and fever.  Respiratory:  Negative for cough and shortness of breath.   Cardiovascular:  Negative for chest pain and leg swelling.  Gastrointestinal:  Negative for abdominal pain, constipation, diarrhea, nausea and vomiting.  Genitourinary:  Positive for flank pain. Negative for difficulty urinating, dysuria and hematuria.  Musculoskeletal:  Positive for back pain. Negative for gait problem.  Neurological:  Negative for dizziness, weakness, numbness and headaches.     Physical Exam Triage Vital Signs ED Triage Vitals  Enc Vitals Group     BP 03/13/23 1715 125/81     Pulse Rate 03/13/23 1715 95     Resp 03/13/23 1715 18     Temp 03/13/23 1715 99.2 F (37.3 C)     Temp src --      SpO2 03/13/23 1715 97 %     Weight --      Height --      Head Circumference --      Peak Flow --      Pain Score 03/13/23 1712 8     Pain Loc --      Pain Edu? --      Excl. in  GC? --    No data found.  Updated Vital Signs BP 125/81   Pulse 95   Temp 99.2 F (37.3 C)   Resp 18   SpO2 97%      Physical Exam Vitals and nursing note reviewed.  Constitutional:      General: She is not in acute distress.    Appearance: Normal appearance. She is not ill-appearing or toxic-appearing.  HENT:     Head: Normocephalic and atraumatic.  Eyes:     General: No scleral icterus.       Right eye: No discharge.        Left eye: No discharge.     Conjunctiva/sclera: Conjunctivae normal.  Cardiovascular:     Rate and Rhythm: Normal rate and regular rhythm.     Heart sounds: Normal heart sounds.  Pulmonary:     Effort: Pulmonary effort is normal. No respiratory distress.     Breath sounds: Normal breath sounds.  Abdominal:     Palpations: Abdomen is soft.     Tenderness: There is no abdominal tenderness. There is right CVA tenderness. There is no left CVA tenderness.  Musculoskeletal:     Cervical back: Neck supple.  Skin:    General: Skin is dry.  Neurological:     General: No focal deficit present.     Mental Status: She is alert. Mental status is at baseline.     Motor: No weakness.     Gait: Gait normal.  Psychiatric:        Mood and Affect: Mood normal. Affect is tearful.        Behavior: Behavior normal.        Thought Content: Thought content normal.     Comments: Patient appears increasingly more uncomfortable and in more pain throughout the duration of her time in urgent care.      UC Treatments / Results  Labs (all labs ordered are listed, but only abnormal results are displayed) Labs Reviewed  URINALYSIS, ROUTINE W REFLEX MICROSCOPIC - Abnormal; Notable for the following components:      Result Value   Specific Gravity, Urine >1.030 (*)    Bilirubin Urine SMALL (*)    Ketones, ur TRACE (*)    Protein, ur 30 (*)  All other components within normal limits  URINALYSIS, MICROSCOPIC (REFLEX) - Abnormal; Notable for the following components:    Bacteria, UA MANY (*)    All other components within normal limits  COMPREHENSIVE METABOLIC PANEL - Abnormal; Notable for the following components:   Sodium 132 (*)    CO2 21 (*)    Glucose, Bld 125 (*)    Calcium 8.7 (*)    All other components within normal limits  CBC WITH DIFFERENTIAL/PLATELET - Abnormal; Notable for the following components:   WBC 21.8 (*)    MCV 101.3 (*)    Neutro Abs 16.6 (*)    Monocytes Absolute 1.2 (*)    Abs Immature Granulocytes 0.11 (*)    All other components within normal limits  LIPASE, BLOOD    EKG   Radiology DG Abdomen 1 View  Result Date: 03/13/2023 CLINICAL DATA:  Right flank pain EXAM: ABDOMEN - 1 VIEW COMPARISON:  None Available. FINDINGS: Nonobstructed gas pattern. Moderate stool in the right colon. No radiopaque calculi. IUD in the pelvis. Bilateral hip replacements IMPRESSION: Nonobstructed gas pattern. Electronically Signed   By: Jasmine Pang M.D.   On: 03/13/2023 18:16    Procedures Procedures (including critical care time)  Medications Ordered in UC Medications  ketorolac (TORADOL) 30 MG/ML injection 30 mg (30 mg Intramuscular Given 03/13/23 1831)    Initial Impression / Assessment and Plan / UC Course  I have reviewed the triage vital signs and the nursing notes.  Pertinent labs & imaging results that were available during my care of the patient were reviewed by me and considered in my medical decision making (see chart for details).   52 year old female presents for right flank/back pain since this morning.  Patient reports she cannot get comfortable.  Reports increased pain when she breathes or lies down.  Reports that she still has pain when she is not moving but it is a little less.  No fever, urinary symptoms. No cough, congestion, chest pain or SOB.  Vitals are all normal and stable.  She is overall well-appearing but appears uncomfortable especially with lying down.  On exam she does have tenderness of the right  thoracolumbar region/right CVA tenderness.  Chest clear to auscultation heart regular rate and rhythm.  Urinalysis without any signs of infection and negative for hemoglobin.  Will obtain KUB to assess for kidney stone but low suspicion for this given essentially normal urinalysis with lack of urinary symptoms.  X-ray negative.  Reviewed result with patient.  Patient requesting something for pain relief.  Reports she took pain medicine about 6 hours ago.  Will give patient 30 mg IM ketorolac in clinic for acute pain relief.  Discussed patient's symptoms with her again.  She denies any history of PE or DVT.  No recent travel.  Does not lead a sedentary lifestyle.  No history of cancer.  Not on any estrogen containing hormone replacements.  She is a smoker.  She has not had any recent surgeries.  PERC score 1.  Advised patient I cannot 100% rule out a PE in the urgent care but I have very low suspicion for it.  She is understanding.  Ordering CBC, CMP and lipase to assess for any signs of potential gallbladder disease.  WBC count 21.8 with a left shift.  Pending CMP and lipase.  Discussed results of CBC with patient immediately.  Advised patient that she will need immediate evaluation in the emergency department.  DDx: Acute pyelonephritis, acute cholecystitis, acute pancreatitis,  pulmonary embolism, pneumonia, appendicitis, nephrolithiasis, gastrointestinal infection.   Patient's mother is taking her to [facility]. Leaving in stable condition.   Lipase normal.  CMP without any significant abnormalities.  Normal transaminases.  Slightly elevated glucose of 125.   Final Clinical Impressions(s) / UC Diagnoses   Final diagnoses:  Right flank pain  Bandemia  Pleuritic pain     Discharge Instructions      -Your white blood cell count is very very high.  You have an infection of some sort going on.  I am very worried about your gallbladder.  You also need to be checked for potential blood clot,  pneumonia, kidney infection, etc.  Please proceed to the emergency department immediately. - Let them know that you received 30 mg IM ketorolac in clinic for acute pain relief.  You have been advised to follow up immediately in the emergency department for concerning signs.symptoms. If you declined EMS transport, please have a family member take you directly to the ED at this time. Do not delay. Based on concerns about condition, if you do not follow up in th e ED, you may risk poor outcomes including worsening of condition, delayed treatment and potentially life threatening issues. If you have declined to go to the ED at this time, you should call your PCP immediately to set up a follow up appointment.  Go to ED for red flag symptoms, including; fevers you cannot reduce with Tylenol/Motrin, severe headaches, vision changes, numbness/weakness in part of the body, lethargy, confusion, intractable vomiting, severe dehydration, chest pain, breathing difficulty, severe persistent abdominal or pelvic pain, signs of severe infection (increased redness, swelling of an area), feeling faint or passing out, dizziness, etc. You should especially go to the ED for sudden acute worsening of condition if you do not elect to go at this time.      ED Prescriptions   None    I have reviewed the PDMP during this encounter.   Shirlee Latch, PA-C 03/13/23 2000

## 2023-03-13 NOTE — ED Triage Notes (Signed)
Pt arrives via POV from [facility]. Pt c/o right sided flank pain that started this morning. Pain worsens with movement. No NVD. No urinary s/s. No fevers/chills. Urgent concerns d/t elevated WBC.Was given Im Toradol with some improvement of s/s.

## 2023-03-13 NOTE — Discharge Instructions (Signed)
-  Your white blood cell count is very very high.  You have an infection of some sort going on.  I am very worried about your gallbladder.  You also need to be checked for potential blood clot, pneumonia, kidney infection, etc.  Please proceed to the emergency department immediately. - Let them know that you received 30 mg IM ketorolac in clinic for acute pain relief.  You have been advised to follow up immediately in the emergency department for concerning signs.symptoms. If you declined EMS transport, please have a family member take you directly to the ED at this time. Do not delay. Based on concerns about condition, if you do not follow up in th e ED, you may risk poor outcomes including worsening of condition, delayed treatment and potentially life threatening issues. If you have declined to go to the ED at this time, you should call your PCP immediately to set up a follow up appointment.  Go to ED for red flag symptoms, including; fevers you cannot reduce with Tylenol/Motrin, severe headaches, vision changes, numbness/weakness in part of the body, lethargy, confusion, intractable vomiting, severe dehydration, chest pain, breathing difficulty, severe persistent abdominal or pelvic pain, signs of severe infection (increased redness, swelling of an area), feeling faint or passing out, dizziness, etc. You should especially go to the ED for sudden acute worsening of condition if you do not elect to go at this time.

## 2023-03-21 NOTE — Progress Notes (Deleted)
New patient visit   Patient: Sara Cummings   DOB: Jul 18, 1971   52 y.o. Female  MRN: [id number] Visit Date: 03/22/2023  Today's healthcare provider: Ronnald Ramp, MD   No chief complaint on file.  Subjective    Sara Cummings is a 52 y.o. female who presents today as a new patient to establish care.      Pertinent PMHX   Last annual physical: ***   *** Medications: ***   ***  Medications: ***    Health Maintenance ***  Concerns for Today:   ***:   Past Medical History:  Diagnosis Date   Anxiety    Hypercholesterolemia    Hypertension    Past Surgical History:  Procedure Laterality Date   BREAST BIOPSY Left 10/09/2018   pending path   NO PAST SURGERIES     Family Status  Relation Name Status   Father  (Not Specified)   Mother  (Not Specified)   MGM  (Not Specified)   Family History  Problem Relation Age of Onset   Lung cancer Father 22   Hypertension Mother    Heart disease Maternal Grandmother    Social History   Socioeconomic History   Marital status: Single    Spouse name: Not on file   Number of children: Not on file   Years of education: Not on file   Highest education level: Not on file  Occupational History   Not on file  Tobacco Use   Smoking status: Every Day    Packs/day: 1    Types: Cigarettes   Smokeless tobacco: Never  Vaping Use   Vaping Use: Never used  Substance and Sexual Activity   Alcohol use: Yes   Drug use: No   Sexual activity: Yes    Birth control/protection: I.U.D.  Other Topics Concern   Not on file  Social History Narrative   Not on file   Social Determinants of Health   Financial Resource Strain: Not on file  Food Insecurity: Not on file  Transportation Needs: Not on file  Physical Activity: Inactive (09/21/2017)   Exercise Vital Sign    Days of Exercise per Week: 0 days    Minutes of Exercise per Session: 0 min  Stress: No Stress Concern Present (09/21/2017)   [employer] of  Occupational Health - Occupational Stress Questionnaire    Feeling of Stress : Only a little  Social Connections: Moderately Isolated (09/21/2017)   Social Connection and Isolation Panel [NHANES]    Frequency of Communication with Friends and Family: Three times a week    Frequency of Social Gatherings with Friends and Family: More than three times a week    Attends Religious Services: Never    Database administrator or Organizations: No    Attends Banker Meetings: Never    Marital Status: Never married    Outpatient Medications Prior to Visit  Medication Sig   ALPRAZolam (XANAX) 0.25 MG tablet TAKE 1 TABLET BY MOUTH EVERY DAY AS NEEDED FOR ANXIETY. DO NOT TAKE WITH OXYCODONE DUE TO RESPIRATORY DEPRESSION   cyclobenzaprine (FLEXERIL) 5 MG tablet Take 5 mg by mouth 3 (three) times daily as needed.   doxycycline (VIBRA-TABS) 100 MG tablet Take 1 tablet (100 mg total) by mouth 2 (two) times daily.   levonorgestrel (MIRENA) 20 MCG/24HR IUD 1 each by Intrauterine route once.   lisinopril (ZESTRIL) 10 MG tablet TAKE 1 TABLET BY MOUTH EVERY DAY   omeprazole (PRILOSEC) 40 MG capsule  Take 40 mg by mouth daily.   oxyCODONE-acetaminophen (PERCOCET) 5-325 MG tablet Take 1 tablet by mouth every 4 (four) hours as needed for severe pain.   PROAIR HFA 108 (90 Base) MCG/ACT inhaler INL 1 PUFF PO Q 6 H PRF SOB OR WHZ   rosuvastatin (CRESTOR) 20 MG tablet Take 20 mg by mouth daily.   simvastatin (ZOCOR) 40 MG tablet TK 1 T PO HS (Patient not taking: Reported on 11/23/2022)   No facility-administered medications prior to visit.   Allergies  Allergen Reactions   Erythromycin Other (See Comments)    As a child, unsure reaction      There is no immunization history on file for this patient.  Health Maintenance  Topic Date Due   COVID-19 Vaccine (1) Never done   HIV Screening  Never done   Hepatitis C Screening  Never done   DTaP/Tdap/Td (1 - Tdap) Never done   Colonoscopy  Never done    Zoster Vaccines- Shingrix (1 of 2) Never done   PAP SMEAR-Modifier  09/21/2020   MAMMOGRAM  10/07/2020   INFLUENZA VACCINE  04/12/2023   HPV VACCINES  Aged Out    Patient Care Team: Orson Eva, NP as PCP - General (Nurse Practitioner)  Review of Systems  {Labs  Heme  Chem  Endocrine  Serology  Results Review (optional):23779}   Objective    There were no vitals taken for this visit. {Show previous vital signs (optional):23777}   Depression Screen     No data to display         No results found for any visits on 03/22/23.   Physical Exam ***    Assessment & Plan      Problem List Items Addressed This Visit   None     No follow-ups on file.      Ronnald Ramp, MD  [facility] [phone] (phone) [phone] (fax)  [facility]

## 2023-03-22 ENCOUNTER — Ambulatory Visit: Payer: BLUE CROSS/BLUE SHIELD | Admitting: Family Medicine

## 2023-03-22 DIAGNOSIS — Z7689 Persons encountering health services in other specified circumstances: Secondary | ICD-10-CM

## 2023-05-08 ENCOUNTER — Emergency Department: Payer: BLUE CROSS/BLUE SHIELD

## 2023-05-08 ENCOUNTER — Other Ambulatory Visit: Payer: Self-pay

## 2023-05-08 ENCOUNTER — Encounter: Payer: Self-pay | Admitting: Emergency Medicine

## 2023-05-08 ENCOUNTER — Emergency Department
Admission: EM | Admit: 2023-05-08 | Discharge: 2023-05-08 | Disposition: A | Discharge status: Home / Self Care | Payer: BLUE CROSS/BLUE SHIELD | Attending: Student in an Organized Health Care Education/Training Program | Admitting: Student in an Organized Health Care Education/Training Program

## 2023-05-08 DIAGNOSIS — Z1152 Encounter for screening for COVID-19: Secondary | ICD-10-CM | POA: Diagnosis not present

## 2023-05-08 DIAGNOSIS — J181 Lobar pneumonia, unspecified organism: Secondary | ICD-10-CM | POA: Insufficient documentation

## 2023-05-08 DIAGNOSIS — D72829 Elevated white blood cell count, unspecified: Secondary | ICD-10-CM | POA: Diagnosis not present

## 2023-05-08 DIAGNOSIS — I10 Essential (primary) hypertension: Secondary | ICD-10-CM | POA: Diagnosis not present

## 2023-05-08 DIAGNOSIS — E876 Hypokalemia: Secondary | ICD-10-CM | POA: Diagnosis not present

## 2023-05-08 DIAGNOSIS — R059 Cough, unspecified: Secondary | ICD-10-CM | POA: Diagnosis present

## 2023-05-08 DIAGNOSIS — J189 Pneumonia, unspecified organism: Secondary | ICD-10-CM

## 2023-05-08 LAB — CBC WITH DIFFERENTIAL/PLATELET
Abs Immature Granulocytes: 0.12 10*3/uL — ABNORMAL HIGH (ref 0.00–0.07)
Basophils Absolute: 0.1 10*3/uL (ref 0.0–0.1)
Basophils Relative: 0 %
Eosinophils Absolute: 0 10*3/uL (ref 0.0–0.5)
Eosinophils Relative: 0 %
HCT: 31.5 % — ABNORMAL LOW (ref 36.0–46.0)
Hemoglobin: 10.2 g/dL — ABNORMAL LOW (ref 12.0–15.0)
Immature Granulocytes: 1 %
Lymphocytes Relative: 14 %
Lymphs Abs: 2.7 10*3/uL (ref 0.7–4.0)
MCH: 30.1 pg (ref 26.0–34.0)
MCHC: 32.4 g/dL (ref 30.0–36.0)
MCV: 92.9 fL (ref 80.0–100.0)
Monocytes Absolute: 1.6 10*3/uL — ABNORMAL HIGH (ref 0.1–1.0)
Monocytes Relative: 8 %
Neutro Abs: 14.8 10*3/uL — ABNORMAL HIGH (ref 1.7–7.7)
Neutrophils Relative %: 77 %
Platelets: 726 10*3/uL — ABNORMAL HIGH (ref 150–400)
RBC: 3.39 MIL/uL — ABNORMAL LOW (ref 3.87–5.11)
RDW: 16.2 % — ABNORMAL HIGH (ref 11.5–15.5)
WBC: 19.4 10*3/uL — ABNORMAL HIGH (ref 4.0–10.5)
nRBC: 0 % (ref 0.0–0.2)

## 2023-05-08 LAB — RESP PANEL BY RT-PCR (RSV, FLU A&B, COVID)  RVPGX2
Influenza A by PCR: NEGATIVE
Influenza B by PCR: NEGATIVE
Resp Syncytial Virus by PCR: NEGATIVE
SARS Coronavirus 2 by RT PCR: NEGATIVE

## 2023-05-08 LAB — BASIC METABOLIC PANEL
Anion gap: 11 (ref 5–15)
BUN: 11 mg/dL (ref 6–20)
CO2: 20 mmol/L — ABNORMAL LOW (ref 22–32)
Calcium: 8.4 mg/dL — ABNORMAL LOW (ref 8.9–10.3)
Chloride: 102 mmol/L (ref 98–111)
Creatinine, Ser: 0.61 mg/dL (ref 0.44–1.00)
GFR, Estimated: >60 mL/min (ref >60)
Glucose, Bld: 120 mg/dL — ABNORMAL HIGH (ref 70–99)
Potassium: 3 mmol/L — ABNORMAL LOW (ref 3.5–5.1)
Sodium: 133 mmol/L — ABNORMAL LOW (ref 135–145)

## 2023-05-08 MED ORDER — IOHEXOL 300 MG/ML  SOLN
75.0000 mL | Freq: Once | INTRAMUSCULAR | Status: AC | PRN
  Administered 2023-05-08: 75 mL via INTRAVENOUS

## 2023-05-08 MED ORDER — POTASSIUM CHLORIDE CRYS ER 20 MEQ PO TBCR: 20.0000 meq | EXTENDED_RELEASE_TABLET | Freq: Two times a day (BID) | ORAL | 0 refills | Status: DC

## 2023-05-08 MED ORDER — LEVOFLOXACIN 750 MG PO TABS: 750.0000 mg | ORAL_TABLET | Freq: Every day | ORAL | 0 refills | Status: DC

## 2023-05-08 NOTE — ED Provider Notes (Signed)
[facility] Provider Note    Event Date/Time   First MD Initiated Contact with Patient 05/08/23 [phone]     (approximate)   History   Nasal Congestion   HPI  Sara Cummings is a 52 y.o. female presents to the ED with complaint of productive cough for the last 3 days and has been taking over-the-counter medication with minimal relief.  Patient reports she had a temperature of 101 last evening.  She denies any nausea, vomiting, diarrhea.  She denies any known sick contacts.  Patient is a 1 pack-a-day smoker and was diagnosed with pneumonia 03/12/2023 and is worried that she has pneumonia once again.  She was also seen at [facility] at which time she was prescribed Levaquin 500 mg, prednisone 20 mg twice daily for 5 days and promethazine DM as needed for cough.  Patient also has a history of hypertension and did not take her medication this morning.     Physical Exam   Triage Vital Signs: ED Triage Vitals  Encounter Vitals Group     BP 05/08/23 0755 (!) 185/158     Systolic BP Percentile --      Diastolic BP Percentile --      Pulse Rate 05/08/23 0754 (!) 111     Resp 05/08/23 0752 18     Temp 05/08/23 0752 98.4 F (36.9 C)     Temp Source 05/08/23 0752 Oral     SpO2 05/08/23 0754 94 %     Weight 05/08/23 0753 175 lb (79.4 kg)     Height 05/08/23 0753 5\' 10"  (1.778 m)     Head Circumference --      Peak Flow --      Pain Score 05/08/23 0752 5     Pain Loc --      Pain Education --      Exclude from Growth Chart --     Most recent vital signs: Vitals:   05/08/23 1208 05/08/23 1244  BP: 108/68 110/70  Pulse: 94 90  Resp: 17 16  Temp: 99.6 F (37.6 C) 98 F (36.7 C)  SpO2: 94% 97%     General: Awake, no distress.  Able to talk in complete sentences without any difficulty or shortness of breath noted. CV:  Good peripheral perfusion.  Resp:  Normal effort.  No rales or rhonchi are noted.  No expiratory wheezes.  Patient does have a congested  cough at this time. Abd:  No distention.  Other:     ED Results / Procedures / Treatments   Labs (all labs ordered are listed, but only abnormal results are displayed) Labs Reviewed  CBC WITH DIFFERENTIAL/PLATELET - Abnormal; Notable for the following components:      Result Value   WBC 19.4 (*)    RBC 3.39 (*)    Hemoglobin 10.2 (*)    HCT 31.5 (*)    RDW 16.2 (*)    Platelets 726 (*)    Neutro Abs 14.8 (*)    Monocytes Absolute 1.6 (*)    Abs Immature Granulocytes 0.12 (*)    All other components within normal limits  BASIC METABOLIC PANEL - Abnormal; Notable for the following components:   Sodium 133 (*)    Potassium 3.0 (*)    CO2 20 (*)    Glucose, Bld 120 (*)    Calcium 8.4 (*)    All other components within normal limits  RESP PANEL BY RT-PCR (RSV, FLU A&B, COVID)  [id number]  RADIOLOGY Chest x-ray per radiologist shows a new right lower lobe collapse with fluid collection developing in the right lung base measuring 13.4 x 10.3 x 6.8 cm with presumed enhancing capsule.  CT chest per radiologist again shows the fluid collection developing in the right lung base which is felt to be a parenchymal infiltrate, empyema or component of pneumonia.    PROCEDURES:  Critical Care performed:   Procedures   MEDICATIONS ORDERED IN ED: Medications  iohexol (OMNIPAQUE) 300 MG/ML solution 75 mL (75 mLs Intravenous Contrast Given 05/08/23 1049)     IMPRESSION / MDM / ASSESSMENT AND PLAN / ED COURSE  I reviewed the triage vital signs and the nursing notes.   Differential diagnosis includes, but is not limited to, COVID, influenza, viral URI, bronchitis, pneumonia, lung mass, COPD exacerbation.  52 year old female presents to the ED with complaint of cough for the last 3 days not improving with over-the-counter medication.  Patient reports that she was diagnosed with pneumonia 03/12/2023 but improved with Levaquin 500 mg, prednisone and Promethazine DM.  Patient is a  pack-a-day smoker and does have an albuterol inhaler at home which she rarely uses.  WBC was elevated at 19.4 in comparison with her previous CBC at the time she was diagnosed with pneumonia with WBC 21.8.  Respiratory panel was negative, BMP showed potassium of 3.0 nonfasting glucose 120.  Initially blood pressure was elevated but patient reports that she did not take her blood pressure medication today.  Prior to discharge patient's blood pressure was 110/70 and O2 sat 97%.  Chest x-ray showed right lower lobe collapse and CT scan with contrast was ordered to further evaluate this.  I discussed findings with patient for possible infiltrate versus empyema versus pneumonia.  I offered to seek admission so that she could get IV antibiotics but patient refused stating that she would rather be home and take oral medication but would return if she began getting worse.  I consulted pulmonology and Dr. Karna Christmas advised for her to continue her albuterol inhaler twice a day, incentive spirometer and to follow-up in his office which patient agrees to do.  She reports that she still has a albuterol inhaler at home and was instructed in the use of the incentive spirometer.  A prescription for Levaquin 750 mg twice daily for 7 days and potassium chloride 20 mg twice daily was sent to the pharmacy.  Patient reports that she will call today and make an appointment with pulmonology.  She was encouraged to discontinue smoking.  She was given strict return precautions to the emergency department if any severe worsening of her symptoms or urgent concerns.    Clinical Course as of 05/08/23 1447  Tue May 08, 2023  1440 Potassium(!): 3.0 [RS]    Clinical Course User Index [RS] Tommi Rumps, PA-C   Patient's presentation is most consistent with acute illness / injury with system symptoms.  FINAL CLINICAL IMPRESSION(S) / ED DIAGNOSES   Final diagnoses:  Community acquired pneumonia of right lower lobe of lung   Hypokalemia     Rx / DC Orders   ED Discharge Orders          Ordered    levofloxacin (LEVAQUIN) 750 MG tablet  Daily        05/08/23 1229    potassium chloride SA (KLOR-CON M) 20 MEQ tablet  2 times daily        05/08/23 1441  Note:  This document was prepared using Dragon voice recognition software and may include unintentional dictation errors.   Tommi Rumps, PA-C 05/08/23 1447    Willy Eddy, MD 05/08/23 [phone]

## 2023-05-08 NOTE — ED Notes (Signed)
Pt educated on how to [place] incentive spirometer. Pt able to correctly demonstrate use of IS.

## 2023-05-08 NOTE — ED Notes (Signed)
See triage note  Presents with productive cough and pain to left side of chest  States coughing up yellow phlegm  Subjective fever last pm  Recently dx'd with pneumonia and finished the meds  Then developed sxs' again couple of days ago  Afebrile on arrival

## 2023-05-08 NOTE — Discharge Instructions (Addendum)
Call today make an appointment with the pulmonologist on-call at [facility], Dr. Karna Christmas.  His contact information is listed on your discharge papers. Use your albuterol inhaler twice a day. Antibiotics were sent to the pharmacy for you to begin taking twice a day for the next 7 days.  An incentive spirometer was given to you while in the emergency department and you should use that every hour. Return to the emergency department if any worsening of your symptoms.  As we have already discussed at that time most likely will be admitted.  Discontinue smoking.  Drink lots of fluids to stay hydrated.

## 2023-05-08 NOTE — ED Notes (Signed)
This RN placed patient on 2L nasal cannula due to patient's O2 sats sitting at 89%.

## 2023-05-08 NOTE — ED Triage Notes (Signed)
Patient to ED via Pov for congestion and cough. Ongoing since Saturday. NAD noted. Productive cough- yellow mucus and taking OTC meds. Hx of hypertension- unsure if she took her BP meds today

## 2023-05-15 ENCOUNTER — Encounter: Payer: Self-pay | Admitting: Nurse Practitioner

## 2023-05-15 ENCOUNTER — Ambulatory Visit: Payer: BLUE CROSS/BLUE SHIELD | Admitting: Nurse Practitioner

## 2023-05-15 ENCOUNTER — Ambulatory Visit
Admission: RE | Admit: 2023-05-15 | Discharge: 2023-05-15 | Disposition: A | Discharge status: Home / Self Care | Payer: BLUE CROSS/BLUE SHIELD | Source: Home / Self Care | Attending: Nurse Practitioner | Admitting: Nurse Practitioner

## 2023-05-15 ENCOUNTER — Ambulatory Visit
Admission: RE | Admit: 2023-05-15 | Discharge: 2023-05-15 | Disposition: A | Discharge status: Home / Self Care | Payer: BLUE CROSS/BLUE SHIELD | Source: Ambulatory Visit | Attending: Nurse Practitioner | Admitting: Nurse Practitioner

## 2023-05-15 VITALS — BP 100/70 | HR 98 | Temp 98.3°F | Resp 16 | Ht 70.0 in | Wt 183.4 lb

## 2023-05-15 DIAGNOSIS — Z1231 Encounter for screening mammogram for malignant neoplasm of breast: Secondary | ICD-10-CM

## 2023-05-15 DIAGNOSIS — J181 Lobar pneumonia, unspecified organism: Secondary | ICD-10-CM

## 2023-05-15 DIAGNOSIS — R052 Subacute cough: Secondary | ICD-10-CM | POA: Insufficient documentation

## 2023-05-15 DIAGNOSIS — Z8701 Personal history of pneumonia (recurrent): Secondary | ICD-10-CM

## 2023-05-15 DIAGNOSIS — F17210 Nicotine dependence, cigarettes, uncomplicated: Secondary | ICD-10-CM | POA: Diagnosis not present

## 2023-05-15 DIAGNOSIS — Z1211 Encounter for screening for malignant neoplasm of colon: Secondary | ICD-10-CM

## 2023-05-15 DIAGNOSIS — Z79899 Other long term (current) drug therapy: Secondary | ICD-10-CM

## 2023-05-15 MED ORDER — VENTOLIN HFA 108 (90 BASE) MCG/ACT IN AERS
1.0000 | INHALATION_SPRAY | Freq: Four times a day (QID) | RESPIRATORY_TRACT | 5 refills | Status: DC | PRN
Start: 2023-05-15 — End: 2023-10-23

## 2023-05-15 MED ORDER — BUPROPION HCL ER (SMOKING DET) 150 MG PO TB12
150.0000 mg | ORAL_TABLET | Freq: Two times a day (BID) | ORAL | 3 refills | Status: DC
Start: 2023-05-15 — End: 2023-11-15

## 2023-05-15 MED ORDER — LISINOPRIL 10 MG PO TABS: 10.0000 mg | ORAL_TABLET | Freq: Every day | ORAL | 0 refills | Status: DC

## 2023-05-15 MED ORDER — ROSUVASTATIN CALCIUM 20 MG PO TABS
20.0000 mg | ORAL_TABLET | Freq: Every day | ORAL | 1 refills | Status: DC
Start: 2023-05-15 — End: 2023-08-23

## 2023-05-15 MED ORDER — CYCLOBENZAPRINE HCL 5 MG PO TABS: 5.0000 mg | ORAL_TABLET | Freq: Three times a day (TID) | ORAL | 1 refills | Status: DC | PRN

## 2023-05-15 MED ORDER — ALPRAZOLAM 0.25 MG PO TABS
0.2500 mg | ORAL_TABLET | Freq: Every day | ORAL | 2 refills | Status: DC | PRN
Start: 2023-05-15 — End: 2023-11-15

## 2023-05-15 NOTE — Progress Notes (Signed)
[facility] [address] [place], [place] [address]  Internal MEDICINE  Office Visit Note  Patient Name: Sara Cummings  DOB: [id number]  MRN: [id number]  Date of Service: 05/15/2023   Complaints/HPI Pt is here for establishment of PCP. Chief Complaint  Patient presents with   New Patient (Initial Visit)    F/u pneumonia     HPI Sara Cummings presents for a new patient visit to establish care.  Well-appearing 52 y.o. female with hx of pneumonia and left total hip replacement. Work: [employer]  Home: live at home alone Diet: fair  Exercise: not right now  Tobacco use: current smoker, was smoking 1 ppd, now down to 1 pack per 3 days. Motivated to quit.  Alcohol use: rarely  Illicit drug use: none  Routine CRC screening: opt for cologuard  Routine mammogram: due now  Pap smear: due this year or next year  Labs: due for routine labs  New or worsening pain: none, sporadic back pain at times.  Recently finished levofloxacin Imaging at the hospital showed a possible empyema or abscess of the   Current Medication: Outpatient Encounter Medications as of 05/15/2023  Medication Sig   buPROPion (ZYBAN) 150 MG 12 hr tablet Take 1 tablet (150 mg total) by mouth 2 (two) times daily.   levonorgestrel (MIRENA) 20 MCG/24HR IUD 1 each by Intrauterine route once.   potassium chloride SA (KLOR-CON M) 20 MEQ tablet Take 1 tablet (20 mEq total) by mouth 2 (two) times daily.   VENTOLIN HFA 108 (90 Base) MCG/ACT inhaler Inhale 1 puff into the lungs every 6 (six) hours as needed for wheezing or shortness of breath.   [DISCONTINUED] ALPRAZolam (XANAX) 0.25 MG tablet TAKE 1 TABLET BY MOUTH EVERY DAY AS NEEDED FOR ANXIETY. DO NOT TAKE WITH OXYCODONE DUE TO RESPIRATORY DEPRESSION   [DISCONTINUED] cyclobenzaprine (FLEXERIL) 5 MG tablet Take 5 mg by mouth 3 (three) times daily as needed.   [DISCONTINUED] levofloxacin (LEVAQUIN) 750 MG tablet Take 1 tablet (750 mg total) by mouth daily for 7  days.   [DISCONTINUED] lisinopril (ZESTRIL) 10 MG tablet TAKE 1 TABLET BY MOUTH EVERY DAY   [DISCONTINUED] omeprazole (PRILOSEC) 40 MG capsule Take 40 mg by mouth daily. (Patient not taking: Reported on 05/17/2023)   [DISCONTINUED] PROAIR HFA 108 (90 Base) MCG/ACT inhaler INL 1 PUFF PO Q 6 H PRF SOB OR WHZ   [DISCONTINUED] rosuvastatin (CRESTOR) 20 MG tablet Take 20 mg by mouth daily.   [DISCONTINUED] simvastatin (ZOCOR) 40 MG tablet    ALPRAZolam (XANAX) 0.25 MG tablet Take 1 tablet (0.25 mg total) by mouth daily as needed for anxiety.   cyclobenzaprine (FLEXERIL) 5 MG tablet Take 1 tablet (5 mg total) by mouth 3 (three) times daily as needed for muscle spasms.   lisinopril (ZESTRIL) 10 MG tablet Take 1 tablet (10 mg total) by mouth daily.   rosuvastatin (CRESTOR) 20 MG tablet Take 1 tablet (20 mg total) by mouth daily.   No facility-administered encounter medications on file as of 05/15/2023.    Surgical History: Past Surgical History:  Procedure Laterality Date   BREAST BIOPSY Left 10/09/2018   pending path   NO PAST SURGERIES      Medical History: Past Medical History:  Diagnosis Date   Anxiety    Hypercholesterolemia    Hypertension     Family History: Family History  Problem Relation Age of Onset   Lung cancer Father 58   Hypertension Mother    Heart disease  Maternal Grandmother     Social History   Socioeconomic History   Marital status: Single    Spouse name: Not on file   Number of children: Not on file   Years of education: Not on file   Highest education level: Not on file  Occupational History   Not on file  Tobacco Use   Smoking status: Every Day    Current packs/day: 1.00    Types: Cigarettes   Smokeless tobacco: Never  Vaping Use   Vaping status: Never Used  Substance and Sexual Activity   Alcohol use: Yes    Comment: occ.   Drug use: No   Sexual activity: Not Currently    Birth control/protection: I.U.D.  Other Topics Concern   Not on file   Social History Narrative   Not on file   Social Determinants of Health   Financial Resource Strain: Not on file  Food Insecurity: No Food Insecurity (05/16/2023)   Hunger Vital Sign    Worried About Running Out of Food in the Last Year: Never true    Ran Out of Food in the Last Year: Never true  Transportation Needs: No Transportation Needs (05/16/2023)   PRAPARE - Administrator, Civil Service (Medical): No    Lack of Transportation (Non-Medical): No  Physical Activity: Inactive (09/21/2017)   Exercise Vital Sign    Days of Exercise per Week: 0 days    Minutes of Exercise per Session: 0 min  Stress: No Stress Concern Present (09/21/2017)   [employer] of Occupational Health - Occupational Stress Questionnaire    Feeling of Stress : Only a little  Social Connections: Moderately Isolated (09/21/2017)   Social Connection and Isolation Panel [NHANES]    Frequency of Communication with Friends and Family: Three times a week    Frequency of Social Gatherings with Friends and Family: More than three times a week    Attends Religious Services: Never    Database administrator or Organizations: No    Attends Banker Meetings: Never    Marital Status: Never married  Intimate Partner Violence: Not At Risk (05/16/2023)   Humiliation, Afraid, Rape, and Kick questionnaire    Fear of Current or Ex-Partner: No    Emotionally Abused: No    Physically Abused: No    Sexually Abused: No     Review of Systems  Constitutional:  Positive for fatigue.  HENT: Negative.    Respiratory: Negative.  Negative for cough, chest tightness, shortness of breath and wheezing.   Cardiovascular: Negative.  Negative for chest pain and palpitations.  Gastrointestinal: Negative.  Negative for abdominal pain, constipation, diarrhea, nausea and vomiting.  Genitourinary: Negative.  Negative for dysuria.  Musculoskeletal: Negative.   Neurological: Negative.   Hematological: Negative.    Psychiatric/Behavioral: Negative.      Vital Signs: BP 100/70   Pulse 98   Temp 98.3 F (36.8 C)   Resp 16   Ht 5\' 10"  (1.778 m)   Wt 183 lb 6.4 oz (83.2 kg)   SpO2 96%   BMI 26.32 kg/m    Physical Exam Vitals reviewed.  Constitutional:      General: She is not in acute distress.    Appearance: Normal appearance. She is not ill-appearing.  HENT:     Head: Normocephalic and atraumatic.  Eyes:     Pupils: Pupils are equal, round, and reactive to light.  Cardiovascular:     Rate and Rhythm: Normal rate and regular  rhythm.  Pulmonary:     Effort: Pulmonary effort is normal. No respiratory distress.  Neurological:     Mental Status: She is alert and oriented to person, place, and time.  Psychiatric:        Mood and Affect: Mood normal.        Behavior: Behavior normal.       Assessment/Plan: 1. Right lower lobe consolidation (HCC) Repeat chest xray ordered  - DG Chest 2 View; Future  2. Subacute cough Repeat chest xray ordered  - DG Chest 2 View; Future  3. History of recent pneumonia Repeat chest xray ordered  - DG Chest 2 View; Future  4. Encounter for screening mammogram for malignant neoplasm of breast Routine mammogram ordered  - MM 3D SCREENING MAMMOGRAM BILATERAL BREAST; Future  5. Encounter for medication review Medication list reviewed, updated and refills ordered  - ALPRAZolam (XANAX) 0.25 MG tablet; Take 1 tablet (0.25 mg total) by mouth daily as needed for anxiety.  Dispense: 60 tablet; Refill: 2 - lisinopril (ZESTRIL) 10 MG tablet; Take 1 tablet (10 mg total) by mouth daily.  Dispense: 90 tablet; Refill: 0 - rosuvastatin (CRESTOR) 20 MG tablet; Take 1 tablet (20 mg total) by mouth daily.  Dispense: 90 tablet; Refill: 1 - VENTOLIN HFA 108 (90 Base) MCG/ACT inhaler; Inhale 1 puff into the lungs every 6 (six) hours as needed for wheezing or shortness of breath.  Dispense: 18 each; Refill: 5 - cyclobenzaprine (FLEXERIL) 5 MG tablet; Take 1 tablet (5  mg total) by mouth 3 (three) times daily as needed for muscle spasms.  Dispense: 30 tablet; Refill: 1  6. Screening for colorectal cancer Cologuard test ordered  - Cologuard  7. Cigarette smoker motivated to quit Try bupropion for smoking cessation - buPROPion (ZYBAN) 150 MG 12 hr tablet; Take 1 tablet (150 mg total) by mouth 2 (two) times daily.  Dispense: 60 tablet; Refill: 3    General Counseling: marti mccallie understanding of the findings of todays visit and agrees with plan of treatment. I have discussed any further diagnostic evaluation that may be needed or ordered today. We also reviewed her medications today. she has been encouraged to call the office with any questions or concerns that should arise related to todays visit.    Orders Placed This Encounter  Procedures   DG Chest 2 View   MM 3D SCREENING MAMMOGRAM BILATERAL BREAST   Cologuard    Meds ordered this encounter  Medications   buPROPion (ZYBAN) 150 MG 12 hr tablet    Sig: Take 1 tablet (150 mg total) by mouth 2 (two) times daily.    Dispense:  60 tablet    Refill:  3    Fill new script   ALPRAZolam (XANAX) 0.25 MG tablet    Sig: Take 1 tablet (0.25 mg total) by mouth daily as needed for anxiety.    Dispense:  60 tablet    Refill:  2    Fill script   lisinopril (ZESTRIL) 10 MG tablet    Sig: Take 1 tablet (10 mg total) by mouth daily.    Dispense:  90 tablet    Refill:  0   rosuvastatin (CRESTOR) 20 MG tablet    Sig: Take 1 tablet (20 mg total) by mouth daily.    Dispense:  90 tablet    Refill:  1   VENTOLIN HFA 108 (90 Base) MCG/ACT inhaler    Sig: Inhale 1 puff into the lungs every 6 (six) hours as  needed for wheezing or shortness of breath.    Dispense:  18 each    Refill:  5   cyclobenzaprine (FLEXERIL) 5 MG tablet    Sig: Take 1 tablet (5 mg total) by mouth 3 (three) times daily as needed for muscle spasms.    Dispense:  30 tablet    Refill:  1    Return in about 6 months (around  11/12/2023) for CPE, Geordie Nooney PCP.  Time spent:30 Minutes Time spent with patient included reviewing progress notes, labs, imaging studies, and discussing plan for follow up.   [place] Controlled Substance Database was reviewed by me for overdose risk score (ORS)   This patient was seen by Sallyanne Kuster, FNP-C in collaboration with Dr. Beverely Risen as a part of collaborative care agreement.   Ziere Docken R. Tedd Sias, MSN, FNP-C Internal Medicine

## 2023-05-16 ENCOUNTER — Telehealth: Payer: Self-pay

## 2023-05-16 ENCOUNTER — Encounter: Payer: Self-pay | Admitting: Internal Medicine

## 2023-05-16 ENCOUNTER — Other Ambulatory Visit: Payer: Self-pay

## 2023-05-16 ENCOUNTER — Inpatient Hospital Stay
Admission: AD | Admit: 2023-05-16 | Discharge: 2023-05-23 | DRG: 178 | Disposition: A | Discharge status: Home / Self Care | Payer: BLUE CROSS/BLUE SHIELD | Attending: Internal Medicine | Admitting: Internal Medicine

## 2023-05-16 ENCOUNTER — Encounter (HOSPITAL_COMMUNITY): Payer: Self-pay

## 2023-05-16 DIAGNOSIS — J449 Chronic obstructive pulmonary disease, unspecified: Secondary | ICD-10-CM | POA: Insufficient documentation

## 2023-05-16 DIAGNOSIS — I10 Essential (primary) hypertension: Secondary | ICD-10-CM | POA: Diagnosis present

## 2023-05-16 DIAGNOSIS — Z881 Allergy status to other antibiotic agents status: Secondary | ICD-10-CM

## 2023-05-16 DIAGNOSIS — F419 Anxiety disorder, unspecified: Secondary | ICD-10-CM | POA: Diagnosis present

## 2023-05-16 DIAGNOSIS — J441 Chronic obstructive pulmonary disease with (acute) exacerbation: Secondary | ICD-10-CM | POA: Diagnosis present

## 2023-05-16 DIAGNOSIS — J90 Pleural effusion, not elsewhere classified: Secondary | ICD-10-CM | POA: Diagnosis present

## 2023-05-16 DIAGNOSIS — E876 Hypokalemia: Secondary | ICD-10-CM | POA: Insufficient documentation

## 2023-05-16 DIAGNOSIS — J851 Abscess of lung with pneumonia: Principal | ICD-10-CM | POA: Diagnosis present

## 2023-05-16 DIAGNOSIS — Z8701 Personal history of pneumonia (recurrent): Secondary | ICD-10-CM

## 2023-05-16 DIAGNOSIS — E7800 Pure hypercholesterolemia, unspecified: Secondary | ICD-10-CM | POA: Diagnosis present

## 2023-05-16 DIAGNOSIS — F17210 Nicotine dependence, cigarettes, uncomplicated: Secondary | ICD-10-CM | POA: Diagnosis present

## 2023-05-16 DIAGNOSIS — Z8249 Family history of ischemic heart disease and other diseases of the circulatory system: Secondary | ICD-10-CM

## 2023-05-16 DIAGNOSIS — Z23 Encounter for immunization: Secondary | ICD-10-CM

## 2023-05-16 DIAGNOSIS — E782 Mixed hyperlipidemia: Secondary | ICD-10-CM | POA: Diagnosis not present

## 2023-05-16 DIAGNOSIS — Z79899 Other long term (current) drug therapy: Secondary | ICD-10-CM

## 2023-05-16 DIAGNOSIS — J869 Pyothorax without fistula: Secondary | ICD-10-CM | POA: Diagnosis present

## 2023-05-16 DIAGNOSIS — D75839 Thrombocytosis, unspecified: Secondary | ICD-10-CM | POA: Diagnosis present

## 2023-05-16 DIAGNOSIS — K219 Gastro-esophageal reflux disease without esophagitis: Secondary | ICD-10-CM | POA: Diagnosis present

## 2023-05-16 DIAGNOSIS — Z975 Presence of (intrauterine) contraceptive device: Secondary | ICD-10-CM | POA: Diagnosis not present

## 2023-05-16 DIAGNOSIS — I9589 Other hypotension: Secondary | ICD-10-CM | POA: Diagnosis not present

## 2023-05-16 DIAGNOSIS — Z801 Family history of malignant neoplasm of trachea, bronchus and lung: Secondary | ICD-10-CM

## 2023-05-16 DIAGNOSIS — B955 Unspecified streptococcus as the cause of diseases classified elsewhere: Secondary | ICD-10-CM | POA: Diagnosis not present

## 2023-05-16 LAB — COMPREHENSIVE METABOLIC PANEL
ALT: 34 U/L (ref 0–44)
AST: 43 U/L — ABNORMAL HIGH (ref 15–41)
Albumin: 2.2 g/dL — ABNORMAL LOW (ref 3.5–5.0)
Alkaline Phosphatase: 235 U/L — ABNORMAL HIGH (ref 38–126)
Anion gap: 11 (ref 5–15)
BUN: 9 mg/dL (ref 6–20)
CO2: 25 mmol/L (ref 22–32)
Calcium: 8.7 mg/dL — ABNORMAL LOW (ref 8.9–10.3)
Chloride: 99 mmol/L (ref 98–111)
Creatinine, Ser: 0.57 mg/dL (ref 0.44–1.00)
GFR, Estimated: >60 mL/min (ref >60)
Glucose, Bld: 118 mg/dL — ABNORMAL HIGH (ref 70–99)
Potassium: 2.8 mmol/L — ABNORMAL LOW (ref 3.5–5.1)
Sodium: 135 mmol/L (ref 135–145)
Total Bilirubin: 0.3 mg/dL (ref 0.3–1.2)
Total Protein: 7.6 g/dL (ref 6.5–8.1)

## 2023-05-16 LAB — CBC WITH DIFFERENTIAL/PLATELET
Abs Immature Granulocytes: 0.14 10*3/uL — ABNORMAL HIGH (ref 0.00–0.07)
Basophils Absolute: 0.1 10*3/uL (ref 0.0–0.1)
Basophils Relative: 0 %
Eosinophils Absolute: 0.2 10*3/uL (ref 0.0–0.5)
Eosinophils Relative: 1 %
HCT: 34.1 % — ABNORMAL LOW (ref 36.0–46.0)
Hemoglobin: 11 g/dL — ABNORMAL LOW (ref 12.0–15.0)
Immature Granulocytes: 1 %
Lymphocytes Relative: 17 %
Lymphs Abs: 2.8 10*3/uL (ref 0.7–4.0)
MCH: 29.8 pg (ref 26.0–34.0)
MCHC: 32.3 g/dL (ref 30.0–36.0)
MCV: 92.4 fL (ref 80.0–100.0)
Monocytes Absolute: 0.9 10*3/uL (ref 0.1–1.0)
Monocytes Relative: 5 %
Neutro Abs: 12.5 10*3/uL — ABNORMAL HIGH (ref 1.7–7.7)
Neutrophils Relative %: 76 %
Platelets: 682 10*3/uL — ABNORMAL HIGH (ref 150–400)
RBC: 3.69 MIL/uL — ABNORMAL LOW (ref 3.87–5.11)
RDW: 16.7 % — ABNORMAL HIGH (ref 11.5–15.5)
WBC: 16.5 10*3/uL — ABNORMAL HIGH (ref 4.0–10.5)
nRBC: 0 % (ref 0.0–0.2)

## 2023-05-16 LAB — SEDIMENTATION RATE: Sed Rate: 126 mm/h — ABNORMAL HIGH (ref 0–30)

## 2023-05-16 LAB — PROCALCITONIN: Procalcitonin: 0.22 ng/mL

## 2023-05-16 MED ORDER — ONDANSETRON HCL 4 MG/2ML IJ SOLN: 4.0000 mg | Freq: Four times a day (QID) | INTRAMUSCULAR | Status: DC | PRN

## 2023-05-16 MED ORDER — ENOXAPARIN SODIUM 40 MG/0.4ML IJ SOSY
40.0000 mg | PREFILLED_SYRINGE | INTRAMUSCULAR | Status: DC
  Administered 2023-05-16 – 2023-05-22 (×7): 40 mg via SUBCUTANEOUS
  Filled 2023-05-16 (×7): qty 0.4

## 2023-05-16 MED ORDER — LEVONORGESTREL 20 MCG/24HR IU IUD: 1.0000 | INTRAUTERINE_SYSTEM | Freq: Once | INTRAUTERINE | Status: DC

## 2023-05-16 MED ORDER — POTASSIUM CHLORIDE CRYS ER 20 MEQ PO TBCR
20.0000 meq | EXTENDED_RELEASE_TABLET | Freq: Two times a day (BID) | ORAL | Status: DC
  Administered 2023-05-16: 20 meq via ORAL
  Filled 2023-05-16: qty 1

## 2023-05-16 MED ORDER — ONDANSETRON HCL 4 MG PO TABS: 4.0000 mg | ORAL_TABLET | Freq: Four times a day (QID) | ORAL | Status: DC | PRN

## 2023-05-16 MED ORDER — ALPRAZOLAM 0.25 MG PO TABS
0.2500 mg | ORAL_TABLET | Freq: Every day | ORAL | Status: DC | PRN
  Administered 2023-05-16 – 2023-05-22 (×7): 0.25 mg via ORAL
  Filled 2023-05-16 (×7): qty 1

## 2023-05-16 MED ORDER — ACETAMINOPHEN 650 MG RE SUPP: 650.0000 mg | Freq: Four times a day (QID) | RECTAL | Status: DC | PRN

## 2023-05-16 MED ORDER — GUAIFENESIN ER 600 MG PO TB12
1200.0000 mg | ORAL_TABLET | Freq: Two times a day (BID) | ORAL | Status: DC
  Administered 2023-05-16 – 2023-05-23 (×14): 1200 mg via ORAL
  Filled 2023-05-16 (×14): qty 2

## 2023-05-16 MED ORDER — ALBUTEROL SULFATE (2.5 MG/3ML) 0.083% IN NEBU
3.0000 mL | INHALATION_SOLUTION | Freq: Four times a day (QID) | RESPIRATORY_TRACT | Status: DC | PRN
  Administered 2023-05-19 (×2): 3 mL via RESPIRATORY_TRACT
  Filled 2023-05-16 (×2): qty 3

## 2023-05-16 MED ORDER — LISINOPRIL 10 MG PO TABS
10.0000 mg | ORAL_TABLET | Freq: Every day | ORAL | Status: DC
  Administered 2023-05-17: 10 mg via ORAL
  Filled 2023-05-16: qty 1

## 2023-05-16 MED ORDER — ACETAMINOPHEN 325 MG PO TABS
650.0000 mg | ORAL_TABLET | Freq: Four times a day (QID) | ORAL | Status: DC | PRN
  Administered 2023-05-19 – 2023-05-23 (×4): 650 mg via ORAL
  Filled 2023-05-16 (×6): qty 2

## 2023-05-16 MED ORDER — CYCLOBENZAPRINE HCL 10 MG PO TABS
5.0000 mg | ORAL_TABLET | Freq: Three times a day (TID) | ORAL | Status: DC | PRN
  Administered 2023-05-20 – 2023-05-22 (×4): 5 mg via ORAL
  Filled 2023-05-16 (×4): qty 1

## 2023-05-16 MED ORDER — BUPROPION HCL ER (SR) 150 MG PO TB12
150.0000 mg | ORAL_TABLET | Freq: Two times a day (BID) | ORAL | Status: DC
  Administered 2023-05-16 – 2023-05-23 (×14): 150 mg via ORAL
  Filled 2023-05-16 (×15): qty 1

## 2023-05-16 MED ORDER — INFLUENZA VIRUS VACC SPLIT PF (FLUZONE) 0.5 ML IM SUSY
0.5000 mL | PREFILLED_SYRINGE | INTRAMUSCULAR | Status: AC
  Administered 2023-05-20: 0.5 mL via INTRAMUSCULAR
  Filled 2023-05-16: qty 0.5

## 2023-05-16 MED ORDER — PANTOPRAZOLE SODIUM 40 MG PO TBEC
40.0000 mg | DELAYED_RELEASE_TABLET | Freq: Every day | ORAL | Status: DC
  Administered 2023-05-17 – 2023-05-23 (×7): 40 mg via ORAL
  Filled 2023-05-16 (×7): qty 1

## 2023-05-16 MED ORDER — ROSUVASTATIN CALCIUM 10 MG PO TABS
20.0000 mg | ORAL_TABLET | Freq: Every day | ORAL | Status: DC
  Administered 2023-05-17 – 2023-05-23 (×7): 20 mg via ORAL
  Filled 2023-05-16 (×7): qty 2

## 2023-05-16 MED ORDER — PNEUMOCOCCAL 20-VAL CONJ VACC 0.5 ML IM SUSY
0.5000 mL | PREFILLED_SYRINGE | INTRAMUSCULAR | Status: AC
  Administered 2023-05-20: 0.5 mL via INTRAMUSCULAR
  Filled 2023-05-16 (×2): qty 0.5

## 2023-05-16 MED ORDER — BENZONATATE 100 MG PO CAPS
200.0000 mg | ORAL_CAPSULE | Freq: Three times a day (TID) | ORAL | Status: DC | PRN
  Administered 2023-05-16 – 2023-05-21 (×9): 200 mg via ORAL
  Filled 2023-05-16 (×9): qty 2

## 2023-05-16 NOTE — Plan of Care (Signed)
  Problem: Clinical Measurements: Goal: Ability to maintain clinical measurements within normal limits will improve Outcome: Progressing   Problem: Activity: Goal: Risk for activity intolerance will decrease Outcome: Progressing   Problem: Nutrition: Goal: Adequate nutrition will be maintained Outcome: Progressing   Problem: Elimination: Goal: Will not experience complications related to bowel motility Outcome: Progressing Goal: Will not experience complications related to urinary retention Outcome: Progressing

## 2023-05-16 NOTE — Telephone Encounter (Signed)
Alyssa lmom for pt that we they are waiting for bed still and if her symptoms worse go to ED

## 2023-05-16 NOTE — H&P (Addendum)
History and Physical    Sara Cummings ZOX:[id number] DOB: 21-Mar-1971 DOA: 05/16/2023  PCP: Sallyanne Kuster, NP (Confirm with patient/family/NH records and if not entered, this has to be entered at [facility] point of entry) Patient coming from: Home  I have personally briefly reviewed patient's old medical records in [facility]  Chief Complaint: Cough   HPI: Sara Cummings is a 52 y.o. female with medical history significant of HTN, HLD, sent from PCPs office for evaluation of empyema/lung abscess.  2 months ago on July 2, patient developed sudden onset of right lower back pain and shortness of breath and came to ED, CT showed RLL infiltrates and consolidation and patient was diagnosed with pneumonia and sent home with doxycycline.  Patient completed antibiotics and symptoms went away.  However intermittently she developed a dry cough which became worse starting 2 weeks ago, became productive with light yellowish sputum, but no chest pain no fever chills.  She came back to ED last Tuesday, CT scan showed right lower lobe lung abscess versus empyema and ED physician plan to admit the patient to the hospital however patient declined offer and went home.  On discharge patient was prescribed with 7 days course of Levaquin and contact information for pulmonary.  Due to insurance issues, patient did not follow-up with pulmonary.  Yesterday, patient went back to see PCP, who performed a x-ray outpatient and found persistent right lower lobe " collapsed and fluid" and sent patient back to hospital.  Patient denies any chest pain, no fever chills or night sweat or weight loss.   Review of Systems: As per HPI otherwise 14 point review of systems negative.    Past Medical History:  Diagnosis Date   Anxiety    Hypercholesterolemia    Hypertension     Past Surgical History:  Procedure Laterality Date   BREAST BIOPSY Left 10/09/2018   pending path   NO PAST SURGERIES       reports that she has  been smoking cigarettes. She has never used smokeless tobacco. She reports current alcohol use. She reports that she does not use drugs.  Allergies  Allergen Reactions   Erythromycin Other (See Comments)    As a child, unsure reaction     Family History  Problem Relation Age of Onset   Lung cancer Father 52   Hypertension Mother    Heart disease Maternal Grandmother      Prior to Admission medications   Medication Sig Start Date End Date Taking? Authorizing Provider  ALPRAZolam (XANAX) 0.25 MG tablet Take 1 tablet (0.25 mg total) by mouth daily as needed for anxiety. 05/15/23   Sallyanne Kuster, NP  buPROPion (ZYBAN) 150 MG 12 hr tablet Take 1 tablet (150 mg total) by mouth 2 (two) times daily. 05/15/23   Sallyanne Kuster, NP  cyclobenzaprine (FLEXERIL) 5 MG tablet Take 1 tablet (5 mg total) by mouth 3 (three) times daily as needed for muscle spasms. 05/15/23   Sallyanne Kuster, NP  levonorgestrel (MIRENA) 20 MCG/24HR IUD 1 each by Intrauterine route once.    [provider]  lisinopril (ZESTRIL) 10 MG tablet Take 1 tablet (10 mg total) by mouth daily. 05/15/23   Sallyanne Kuster, NP  omeprazole (PRILOSEC) 40 MG capsule Take 40 mg by mouth daily.    [provider]  potassium chloride SA (KLOR-CON M) 20 MEQ tablet Take 1 tablet (20 mEq total) by mouth 2 (two) times daily. 05/08/23   Tommi Rumps, PA-C  rosuvastatin (CRESTOR) 20  MG tablet Take 1 tablet (20 mg total) by mouth daily. 05/15/23   Sallyanne Kuster, NP  VENTOLIN HFA 108 (90 Base) MCG/ACT inhaler Inhale 1 puff into the lungs every 6 (six) hours as needed for wheezing or shortness of breath. 05/15/23   Sallyanne Kuster, NP    Physical Exam: Vitals:   05/16/23 1714  BP: 117/77  Pulse: 87  Resp: 15  Temp: 98.3 F (36.8 C)  TempSrc: Oral  SpO2: 96%  Weight: 83.9 kg  Height: 5\' 10"  (1.778 m)    Constitutional: NAD, calm, comfortable Vitals:   05/16/23 1714  BP: 117/77  Pulse: 87  Resp: 15  Temp:  98.3 F (36.8 C)  TempSrc: Oral  SpO2: 96%  Weight: 83.9 kg  Height: 5\' 10"  (1.778 m)   Eyes: PERRL, lids and conjunctivae normal ENMT: Mucous membranes are moist. Posterior pharynx clear of any exudate or lesions.Normal dentition.  Neck: normal, supple, no masses, no thyromegaly Respiratory: Diminished breathing sound on right lower field, no wheezing, no crackles. Normal respiratory effort. No accessory muscle use.  Cardiovascular: Regular rate and rhythm, no murmurs / rubs / gallops. No extremity edema. 2+ pedal pulses. No carotid bruits.  Abdomen: no tenderness, no masses palpated. No hepatosplenomegaly. Bowel sounds positive.  Musculoskeletal: no clubbing / cyanosis. No joint deformity upper and lower extremities. Good ROM, no contractures. Normal muscle tone.  Skin: no rashes, lesions, ulcers. No induration Neurologic: CN 2-12 grossly intact. Sensation intact, DTR normal. Strength 5/5 in all 4.  Psychiatric: Normal judgment and insight. Alert and oriented x 3. Normal mood.     Labs on Admission: I have personally reviewed following labs and imaging studies  CBC: No results for input(s): "WBC", "NEUTROABS", "HGB", "HCT", "MCV", "PLT" in the last 168 hours. Basic Metabolic Panel: No results for input(s): "NA", "K", "CL", "CO2", "GLUCOSE", "BUN", "CREATININE", "CALCIUM", "MG", "PHOS" in the last 168 hours. GFR: Estimated Creatinine Clearance: 95.9 mL/min (by C-G formula based on SCr of 0.61 mg/dL). Liver Function Tests: No results for input(s): "AST", "ALT", "ALKPHOS", "BILITOT", "PROT", "ALBUMIN" in the last 168 hours. No results for input(s): "LIPASE", "AMYLASE" in the last 168 hours. No results for input(s): "AMMONIA" in the last 168 hours. Coagulation Profile: No results for input(s): "INR", "PROTIME" in the last 168 hours. Cardiac Enzymes: No results for input(s): "CKTOTAL", "CKMB", "CKMBINDEX", "TROPONINI" in the last 168 hours. BNP (last 3 results) No results for  input(s): "PROBNP" in the last 8760 hours. HbA1C: No results for input(s): "HGBA1C" in the last 72 hours. CBG: No results for input(s): "GLUCAP" in the last 168 hours. Lipid Profile: No results for input(s): "CHOL", "HDL", "LDLCALC", "TRIG", "CHOLHDL", "LDLDIRECT" in the last 72 hours. Thyroid Function Tests: No results for input(s): "TSH", "T4TOTAL", "FREET4", "T3FREE", "THYROIDAB" in the last 72 hours. Anemia Panel: No results for input(s): "VITAMINB12", "FOLATE", "FERRITIN", "TIBC", "IRON", "RETICCTPCT" in the last 72 hours. Urine analysis:    Component Value Date/Time   COLORURINE YELLOW (A) 03/13/2023 1941   APPEARANCEUR HAZY (A) 03/13/2023 1941   LABSPEC 1.026 03/13/2023 1941   PHURINE 6.0 03/13/2023 1941   GLUCOSEU NEGATIVE 03/13/2023 1941   HGBUR NEGATIVE 03/13/2023 1941   BILIRUBINUR NEGATIVE 03/13/2023 1941   KETONESUR 20 (A) 03/13/2023 1941   PROTEINUR NEGATIVE 03/13/2023 1941   NITRITE NEGATIVE 03/13/2023 1941   LEUKOCYTESUR NEGATIVE 03/13/2023 1941    Radiological Exams on Admission: DG Chest 2 View  Result Date: 05/15/2023 CLINICAL DATA:  Pneumonia and right lower lobe atelectasis. EXAM: CHEST -  2 VIEW COMPARISON:  May 08, 2023. FINDINGS: The heart size and mediastinal contours are within normal limits. Left lung is clear. Stable large opacity is seen in right lung base consistent with abscess or empyema as noted on prior CT scan. The visualized skeletal structures are unremarkable. IMPRESSION: Stable large right lung base opacity is noted concerning for abscess or empyema as noted on prior CT scan. Electronically Signed   By: Lupita Raider M.D.   On: 05/15/2023 16:30    EKG: Ordered  Assessment/Plan Principal Problem:   Empyema (HCC)  (please populate well all problems here in Problem List. (For example, if patient is on BP meds at home and you resume or decide to hold them, it is a problem that needs to be her. Same for CAD, COPD, HLD and so on)  RLL empyema  vs lung abscess vs parapneumonic effusion -Patient has no fever, no significant hypoxia or chest pain, clinically suspect parapneumonic pleural effusion versus other more severe complications such as empyema and lung abscess. Patient just completed 7 days course of Levaquin p.o and most of her symptoms of cough and SOB are improving.  Decided to hold off antibiotics until pleural fluid culture available. -Consult pulmonary for chest tube -Send labs of CBC, CMP, procalcitonin ESR and CRP -HIV screening, MRSA screening  HTN -Stable, continue ACEI  HLD -Continue statin    DVT prophylaxis: Lovenox Code Status: Full code Family Communication: Mother at bedside Disposition Plan: Sick patient with right lung Pamer requiring inpatient pulmonary procedure, expect more than 2 midnight hospital stay Consults called: Pulmonary Admission status: MedSurg admission   Emeline General MD [facility] Pager [phone]  05/16/2023, 5:47 PM

## 2023-05-16 NOTE — Telephone Encounter (Signed)
Pt called that just for FYI to alyssa that hospital called her back and her bed is ready

## 2023-05-17 DIAGNOSIS — E876 Hypokalemia: Secondary | ICD-10-CM | POA: Diagnosis not present

## 2023-05-17 DIAGNOSIS — J869 Pyothorax without fistula: Secondary | ICD-10-CM | POA: Diagnosis not present

## 2023-05-17 DIAGNOSIS — J851 Abscess of lung with pneumonia: Secondary | ICD-10-CM | POA: Diagnosis not present

## 2023-05-17 DIAGNOSIS — E782 Mixed hyperlipidemia: Secondary | ICD-10-CM

## 2023-05-17 DIAGNOSIS — I10 Essential (primary) hypertension: Secondary | ICD-10-CM | POA: Diagnosis not present

## 2023-05-17 LAB — CBC
HCT: 31.5 % — ABNORMAL LOW (ref 36.0–46.0)
Hemoglobin: 10.6 g/dL — ABNORMAL LOW (ref 12.0–15.0)
MCH: 30.4 pg (ref 26.0–34.0)
MCHC: 33.7 g/dL (ref 30.0–36.0)
MCV: 90.3 fL (ref 80.0–100.0)
Platelets: 665 10*3/uL — ABNORMAL HIGH (ref 150–400)
RBC: 3.49 MIL/uL — ABNORMAL LOW (ref 3.87–5.11)
RDW: 17.1 % — ABNORMAL HIGH (ref 11.5–15.5)
WBC: 15.8 10*3/uL — ABNORMAL HIGH (ref 4.0–10.5)
nRBC: 0 % (ref 0.0–0.2)

## 2023-05-17 LAB — BASIC METABOLIC PANEL
Anion gap: 11 (ref 5–15)
BUN: 5 mg/dL — ABNORMAL LOW (ref 6–20)
CO2: 22 mmol/L (ref 22–32)
Calcium: 8.4 mg/dL — ABNORMAL LOW (ref 8.9–10.3)
Chloride: 102 mmol/L (ref 98–111)
Creatinine, Ser: 0.51 mg/dL (ref 0.44–1.00)
GFR, Estimated: >60 mL/min (ref >60)
Glucose, Bld: 106 mg/dL — ABNORMAL HIGH (ref 70–99)
Potassium: 3.2 mmol/L — ABNORMAL LOW (ref 3.5–5.1)
Sodium: 135 mmol/L (ref 135–145)

## 2023-05-17 LAB — EXPECTORATED SPUTUM ASSESSMENT W GRAM STAIN, RFLX TO RESP C

## 2023-05-17 LAB — MRSA NEXT GEN BY PCR, NASAL: MRSA by PCR Next Gen: NOT DETECTED

## 2023-05-17 LAB — C-REACTIVE PROTEIN: CRP: 13.8 mg/dL — ABNORMAL HIGH (ref <1.0)

## 2023-05-17 LAB — HIV ANTIBODY (ROUTINE TESTING W REFLEX): HIV Screen 4th Generation wRfx: NONREACTIVE

## 2023-05-17 LAB — STREP PNEUMONIAE URINARY ANTIGEN: Strep Pneumo Urinary Antigen: NEGATIVE

## 2023-05-17 MED ORDER — POTASSIUM CHLORIDE CRYS ER 20 MEQ PO TBCR
40.0000 meq | EXTENDED_RELEASE_TABLET | Freq: Two times a day (BID) | ORAL | Status: AC
  Administered 2023-05-17 – 2023-05-18 (×3): 40 meq via ORAL
  Filled 2023-05-17 (×3): qty 2

## 2023-05-17 MED ORDER — POTASSIUM CHLORIDE 10 MEQ/100ML IV SOLN: 10.0000 meq | INTRAVENOUS | Status: DC

## 2023-05-17 MED ORDER — SODIUM CHLORIDE 0.9 % IV SOLN
3.0000 g | Freq: Four times a day (QID) | INTRAVENOUS | Status: DC
  Administered 2023-05-17 – 2023-05-23 (×23): 3 g via INTRAVENOUS
  Filled 2023-05-17 (×25): qty 8

## 2023-05-17 NOTE — Progress Notes (Signed)
Noticed pt temp is trending up and down as well as BP. At 2253 was at 100.3 BP of 107/95 MAP 91 HR 100. At 0023 temp at 99.6  BP at 105/62 MAP 73 HR 104. At 0209 temp at 99.9, BP 97/61 MAP 72 HR 90. Rest of vitals stable. Pt NPO at this time. MD Mansy made aware.   Update 0236: See new orders. Will continue to monitor.

## 2023-05-17 NOTE — Plan of Care (Signed)
  Problem: Education: Goal: Knowledge of General Education information will improve Description: Including pain rating scale, medication(s)/side effects and non-pharmacologic comfort measures Outcome: Progressing   Problem: Clinical Measurements: Goal: Will remain free from infection Outcome: Progressing   Problem: Clinical Measurements: Goal: Respiratory complications will improve Outcome: Progressing   Problem: Clinical Measurements: Goal: Cardiovascular complication will be avoided Outcome: Progressing   Problem: Activity: Goal: Risk for activity intolerance will decrease Outcome: Progressing   

## 2023-05-17 NOTE — Plan of Care (Signed)

## 2023-05-17 NOTE — Consult Note (Signed)
HPI Case of a 52 year old female patient with a past medical history of anxiety hypertension hyperlipidemia and GERD presenting to [facility] with cough and sputum production.  Pulmonary team has been consulted for question of lung abscess/empyema.   She presented in July 2024 for flank pain and underwent a CT abdomen pelvis and revealed infiltrates and early abscess versus parapneumonic effusion in the right lower lobe.  She was prescribed doxycycline for 1 week and was supposed to follow-up with pulmonary however she felt better up until 2 weeks ago when she started having cough with sputum production and therefore presented to the ED yesterday.  She denies any fevers as outpatient.  She denies any night sweats chills nausea or vomiting.  Found to be afebrile, hemodynamically stable.  Labs with leukocytosis with WBC of 15.8.  Thrombocytosis with platelets of 665.  Elevated CRP at 13.8.  CT chest with contrast revealed a right lower lobe 13.4 x 10.3 x 6.8 fluid collection with enhancing capsule that is possibly  representing Empyema vs Lung abscess.   FH - Father with lung cancer  SH - Smokes 1ppd for 30 years, drinks couple of beers per day, no illicit drug use.   Bedside US revealed a granular fluid with some septations.   Physical Exam   GEN NAD  HEENT Supple neck, reactive pupils, EOMI  CVS Normal S1, Normal S2, RRR  Lungs Diffuse wheezing heard bilaterally  Abdomen Soft, non tender, non distended +BS  Extremities Warm and well perfused   Labs and imaging were reviewed   Assessment and Plan  Case of a 52 year old female patient with a past medical history of anxiety hypertension hyperlipidemia and GERD presenting to [facility] with cough and sputum production.  Pulmonary team has been consulted for question of lung abscess/empyema.   #Empyema vs Lung Abscess   Her CT chest in July showed possibly an early forming abscess versus loculated  effusion.  She had 1 week of doxycycline and never followed up.  She is presenting now with this fluid collection with thick rim on her chest CT localized to the right lower lobe.  Difficult from the CT in all views coronal/sagittal and cross-sectional to determine whether this is lung abscess or empyema or a combination of both.  However it is well-circumscribed with thick rim somnolent this was seen posteriorly. HIV negative.   She is hemodynamically stable with minimal symptoms.  Her white count is elevated at 15.8 with elevated inflammatory markers which can go with empyema or lung abscess.  Will proceed with conservative management and started on antibiotics with Unasyn IV.  Will send sputum culture and should that not result with the micro diagnosis then we will proceed with bronchoscopic evaluation and BAL.  []  Start patient on IV unasyn  []  Sputum culture   Thank you for the interesting conuslt.  Pulmonary team will follow.  Janann Colonel, MD .

## 2023-05-17 NOTE — Progress Notes (Signed)
Progress Note   Patient: Sara Cummings WGN:[id number] DOB: 1970/09/23 DOA: 05/16/2023     1 DOS: the patient was seen and examined on 05/17/2023   Brief hospital course: Sara Cummings is a 52 y.o. female with medical history significant of HTN, HLD, sent from PCPs office for evaluation of right lung empyema/ lung abscess. Patient was diagnosed with pneumonia July 2nd and sent home with doxycycline. Patient declined admission 2 weeks ago when CT showed right LL abscess. Yesterday, patient went back to see PCP, xray showed persistent right lower lobe " collapsed and fluid" and so advised to come to hospital.   Assessment and Plan: RLL empyema vs lung abscess- Possible parapneumonic effusion. Pulmonology consult called for possible chest tube placement. Discussed with pulm - no need for NPO for procedure. WBC 15.8, ESR, CRP, platelets elevated. She has no shortness of breath, nor hypoxia. No antibiotics for now, follow pleural fluid cultures.  Hypokalemia- Oral potassium supplements ordered. Monitor daily electrolytes.  Hypertension- BP lower side, hold Lisinopril.  Hyperlipidemia- Continue statin.  Anxiety disorder- Home dose Klonopin ordered.      Subjective: Patient is seen and examined today morning. She is anxious about the procedure. Has no shortness of breath or hypoxia. Lying comfortable.  Physical Exam: Vitals:   05/17/23 0332 05/17/23 0438 05/17/23 0539 05/17/23 0825  BP: (!) 93/58 (!) 90/56 96/63 104/64  Pulse: 85 84  84  Resp: 20 20 20 18   Temp: 100 F (37.8 C) 98.6 F (37 C) 98.8 F (37.1 C) 99.1 F (37.3 C)  TempSrc: Oral Oral Oral Oral  SpO2: 94% 93% 94% 94%  Weight:      Height:       General -  Middle aged Caucasian anxious female, no apparent distress HEENT - PERRLA, EOMI, atraumatic head, non tender sinuses. Lung - right sided decreased breath sounds, diffuse rhonchi Heart - S1, S2 heard, no murmurs, rubs, no pedal edema Neuro - Alert, awake and  oriented x 3, non focal exam. Skin - Warm and dry. Data Reviewed:     Latest Ref Rng & Units 05/17/2023    8:46 AM 05/16/2023    6:25 PM 05/08/2023   10:05 AM  CBC  WBC 4.0 - 10.5 K/uL 15.8  16.5  19.4   Hemoglobin 12.0 - 15.0 g/dL 78.4  69.6  29.5   Hematocrit 36.0 - 46.0 % 31.5  34.1  31.5   Platelets 150 - 400 K/uL 665  682  726       Latest Ref Rng & Units 05/17/2023    8:46 AM 05/16/2023    6:25 PM 05/08/2023   10:05 AM  BMP  Glucose 70 - 99 mg/dL 284  132  440   BUN 6 - 20 mg/dL 5  9  11    Creatinine 0.44 - 1.00 mg/dL 1.02  7.25  3.66   Sodium 135 - 145 mmol/L 135  135  133   Potassium 3.5 - 5.1 mmol/L 3.2  2.8  3.0   Chloride 98 - 111 mmol/L 102  99  102   CO2 22 - 32 mmol/L 22  25  20    Calcium 8.9 - 10.3 mg/dL 8.4  8.7  8.4      Family Communication: Patient understands and agrees with above plan, though she is very anxious  Disposition: Status is: Inpatient Remains inpatient appropriate because: pulmonology work up of RLL abscess  Planned Discharge Destination: Home    Time spent: 43 minutes  Author: Marcelino Duster,  MD 05/17/2023 11:58 AM  For on call review [email or web address].

## 2023-05-18 DIAGNOSIS — E782 Mixed hyperlipidemia: Secondary | ICD-10-CM | POA: Diagnosis not present

## 2023-05-18 DIAGNOSIS — I9589 Other hypotension: Secondary | ICD-10-CM

## 2023-05-18 DIAGNOSIS — E876 Hypokalemia: Secondary | ICD-10-CM | POA: Diagnosis not present

## 2023-05-18 DIAGNOSIS — J869 Pyothorax without fistula: Secondary | ICD-10-CM | POA: Diagnosis not present

## 2023-05-18 DIAGNOSIS — I10 Essential (primary) hypertension: Secondary | ICD-10-CM | POA: Diagnosis not present

## 2023-05-18 LAB — CBC
HCT: 31.1 % — ABNORMAL LOW (ref 36.0–46.0)
Hemoglobin: 9.9 g/dL — ABNORMAL LOW (ref 12.0–15.0)
MCH: 29.6 pg (ref 26.0–34.0)
MCHC: 31.8 g/dL (ref 30.0–36.0)
MCV: 93.1 fL (ref 80.0–100.0)
Platelets: 610 10*3/uL — ABNORMAL HIGH (ref 150–400)
RBC: 3.34 MIL/uL — ABNORMAL LOW (ref 3.87–5.11)
RDW: 17 % — ABNORMAL HIGH (ref 11.5–15.5)
WBC: 12.1 10*3/uL — ABNORMAL HIGH (ref 4.0–10.5)
nRBC: 0 % (ref 0.0–0.2)

## 2023-05-18 LAB — PROTIME-INR
INR: 1.1 (ref 0.8–1.2)
Prothrombin Time: 14.6 s (ref 11.4–15.2)

## 2023-05-18 LAB — BASIC METABOLIC PANEL
Anion gap: 8 (ref 5–15)
BUN: 6 mg/dL (ref 6–20)
CO2: 24 mmol/L (ref 22–32)
Calcium: 8.2 mg/dL — ABNORMAL LOW (ref 8.9–10.3)
Chloride: 105 mmol/L (ref 98–111)
Creatinine, Ser: 0.52 mg/dL (ref 0.44–1.00)
GFR, Estimated: >60 mL/min (ref >60)
Glucose, Bld: 108 mg/dL — ABNORMAL HIGH (ref 70–99)
Potassium: 4.1 mmol/L (ref 3.5–5.1)
Sodium: 137 mmol/L (ref 135–145)

## 2023-05-18 MED ORDER — SODIUM CHLORIDE 0.9 % IV BOLUS
1000.0000 mL | Freq: Once | INTRAVENOUS | Status: AC
  Administered 2023-05-18: 1000 mL via INTRAVENOUS

## 2023-05-18 NOTE — Progress Notes (Signed)
Subjective: No major events overnight. This morning blood pressure has been low.  Sputum gram stain with GPC, GPR and GNR.  Physical Exam    GEN NAD  HEENT Supple neck, reactive pupils, EOMI  CVS Normal S1, Normal S2, RRR  Lungs Wheezing improved Abdomen Soft, non tender, non distended +BS  Extremities Warm and well perfused    Labs and imaging were reviewed    Assessment and Plan  Case of a 52 year old female patient with a past medical history of anxiety hypertension hyperlipidemia and GERD presenting to [facility] with cough and sputum production.  Pulmonary team has been consulted for question of lung abscess/empyema.    #Empyema vs Lung Abscess    Her CT chest in July showed possibly an early forming abscess versus loculated effusion.  She had 1 week of doxycycline and never followed up.  She is presenting now with this fluid collection with thick rim on her chest CT localized to the right lower lobe.  Difficult from the CT in all views coronal/sagittal and cross-sectional to determine whether this is lung abscess or empyema or a combination of both.  However it is well-circumscribed with thick rim somnolent this was seen posteriorly. HIV negative.   Discussed with Throacic Surgery at [facility], favors empyema and therefore we will go ahead an order a CT guided Chest Tube.    []  C/w Unasyn []  CT guided Chest tube placement. []  Send pleural fluid for chemistry gram stain and culture  []  Tpa/Dnase for 72hrs after which obtian a chest CT to re-evaluate.  []  CT to suction at -20cmH2O  []  Daily CXR.    Thank you for the interesting conuslt.  Pulmonary Team will follow  I spent 45 minutes caring for this patient today, including preparing to see the patient, obtaining a medical history , performing a medically appropriate examination and/or evaluation, counseling and educating the patient/family/caregiver, ordering medications, tests, or procedures, referring  and communicating with other health care professionals (not separately reported), documenting clinical information in the electronic health record, independently interpreting results (not separately reported/billed) and communicating results to the patient/family/caregiver, and care coordination (not separately reported/billed)  Janann Colonel, MD [place] [facility] 05/18/2023 2:21 PM

## 2023-05-18 NOTE — Consult Note (Signed)
Chief Complaint: Patient was seen in consultation today for empyema  Referring Physician(s): Abernathy,Alyssa  Supervising Physician: Pernell Dupre  Patient Status: [facility] - In-pt  History of Present Illness: Sara Cummings is a 52 y.o. female with PMH significant for anxiety, hypercholesterolemia, and hypertension being seen today in relation to right-sided empyema. Patient originally was seen in July 2024 for flank pain and CT AP at that time revealed possible early abscess vs parapneumonic effusion. Patient was treated with doxycycline for one week but did not follow-up with pulmonary. Patient was admitted to [facility] on 05/16/23. CXR from 05/14/22 revealed persistent concern for right lung abscess vs empyema. IR has been consulted for image-guided chest tube placement.  Past Medical History:  Diagnosis Date   Anxiety    Hypercholesterolemia    Hypertension     Past Surgical History:  Procedure Laterality Date   BREAST BIOPSY Left 10/09/2018   pending path   NO PAST SURGERIES      Allergies: Erythromycin  Medications: Prior to Admission medications   Medication Sig Start Date End Date Taking? Authorizing Provider  ALPRAZolam (XANAX) 0.25 MG tablet Take 1 tablet (0.25 mg total) by mouth daily as needed for anxiety. 05/15/23  Yes Abernathy, Arlyss Repress, NP  cyclobenzaprine (FLEXERIL) 5 MG tablet Take 1 tablet (5 mg total) by mouth 3 (three) times daily as needed for muscle spasms. 05/15/23  Yes Abernathy, Alyssa, NP  lisinopril (ZESTRIL) 10 MG tablet Take 1 tablet (10 mg total) by mouth daily. 05/15/23  Yes Abernathy, Alyssa, NP  potassium chloride SA (KLOR-CON M) 20 MEQ tablet Take 1 tablet (20 mEq total) by mouth 2 (two) times daily. 05/08/23  Yes Bridget Hartshorn L, PA-C  rosuvastatin (CRESTOR) 20 MG tablet Take 1 tablet (20 mg total) by mouth daily. 05/15/23  Yes Abernathy, Alyssa, NP  VENTOLIN HFA 108 (90 Base) MCG/ACT inhaler Inhale 1 puff into the lungs every 6 (six) hours as needed for  wheezing or shortness of breath. 05/15/23  Yes Abernathy, Arlyss Repress, NP  buPROPion (ZYBAN) 150 MG 12 hr tablet Take 1 tablet (150 mg total) by mouth 2 (two) times daily. Patient not taking: Reported on 05/17/2023 05/15/23   Sallyanne Kuster, NP  levonorgestrel (MIRENA) 20 MCG/24HR IUD 1 each by Intrauterine route once.    [provider]  omeprazole (PRILOSEC) 40 MG capsule Take 40 mg by mouth daily. Patient not taking: Reported on 05/17/2023    [provider]     Family History  Problem Relation Age of Onset   Lung cancer Father 67   Hypertension Mother    Heart disease Maternal Grandmother     Social History   Socioeconomic History   Marital status: Single    Spouse name: Not on file   Number of children: Not on file   Years of education: Not on file   Highest education level: Not on file  Occupational History   Not on file  Tobacco Use   Smoking status: Every Day    Current packs/day: 1.00    Types: Cigarettes   Smokeless tobacco: Never  Vaping Use   Vaping status: Never Used  Substance and Sexual Activity   Alcohol use: Yes    Comment: occ.   Drug use: No   Sexual activity: Not Currently    Birth control/protection: I.U.D.  Other Topics Concern   Not on file  Social History Narrative   Not on file   Social Determinants of Health   Financial Resource Strain: Not on  file  Food Insecurity: No Food Insecurity (05/16/2023)   Hunger Vital Sign    Worried About Running Out of Food in the Last Year: Never true    Ran Out of Food in the Last Year: Never true  Transportation Needs: No Transportation Needs (05/16/2023)   PRAPARE - Administrator, Civil Service (Medical): No    Lack of Transportation (Non-Medical): No  Physical Activity: Inactive (09/21/2017)   Exercise Vital Sign    Days of Exercise per Week: 0 days    Minutes of Exercise per Session: 0 min  Stress: No Stress Concern Present (09/21/2017)   [employer] of Occupational Health -  Occupational Stress Questionnaire    Feeling of Stress : Only a little  Social Connections: Moderately Isolated (09/21/2017)   Social Connection and Isolation Panel [NHANES]    Frequency of Communication with Friends and Family: Three times a week    Frequency of Social Gatherings with Friends and Family: More than three times a week    Attends Religious Services: Never    Database administrator or Organizations: No    Attends Banker Meetings: Never    Marital Status: Never married    Code Status: Full code  Review of Systems: A 12 point ROS discussed and pertinent positives are indicated in the HPI above.  All other systems are negative.  Review of Systems  Constitutional:  Positive for fever. Negative for chills.  Respiratory:  Negative for chest tightness and shortness of breath.   Cardiovascular:  Negative for chest pain and leg swelling.  Gastrointestinal:  Negative for abdominal pain, diarrhea, nausea and vomiting.  Neurological:  Negative for dizziness and headaches.  Psychiatric/Behavioral:  Negative for confusion.     Vital Signs: BP (!) 97/59 (BP Location: Left Arm)   Pulse 75   Temp 98.5 F (36.9 C) (Oral)   Resp 18   Ht 5\' 10"  (1.778 m)   Wt 185 lb (83.9 kg)   SpO2 97%   BMI 26.54 kg/m     Physical Exam Vitals reviewed.  Constitutional:      General: She is not in acute distress.    Appearance: She is not ill-appearing.  HENT:     Mouth/Throat:     Mouth: Mucous membranes are moist.  Eyes:     Pupils: Pupils are equal, round, and reactive to light.  Cardiovascular:     Rate and Rhythm: Normal rate and regular rhythm.     Pulses: Normal pulses.     Heart sounds: Normal heart sounds.  Pulmonary:     Effort: Pulmonary effort is normal.     Breath sounds: Wheezing present.     Comments: Wheezing heard bilaterally Abdominal:     Palpations: Abdomen is soft.     Tenderness: There is no abdominal tenderness.  Musculoskeletal:     Right  lower leg: No edema.     Left lower leg: No edema.  Skin:    General: Skin is warm and dry.  Neurological:     Mental Status: She is alert and oriented to person, place, and time.  Psychiatric:        Mood and Affect: Mood normal.        Behavior: Behavior normal.        Thought Content: Thought content normal.        Judgment: Judgment normal.     Imaging: DG Chest 2 View  Result Date: 05/15/2023 CLINICAL DATA:  Pneumonia and  right lower lobe atelectasis. EXAM: CHEST - 2 VIEW COMPARISON:  May 08, 2023. FINDINGS: The heart size and mediastinal contours are within normal limits. Left lung is clear. Stable large opacity is seen in right lung base consistent with abscess or empyema as noted on prior CT scan. The visualized skeletal structures are unremarkable. IMPRESSION: Stable large right lung base opacity is noted concerning for abscess or empyema as noted on prior CT scan. Electronically Signed   By: Lupita Raider M.D.   On: 05/15/2023 16:30   CT Chest W Contrast  Result Date: 05/08/2023 CLINICAL DATA:  Right lower lobe collapse.  Cough. EXAM: CT CHEST WITH CONTRAST TECHNIQUE: Multidetector CT imaging of the chest was performed during intravenous contrast administration. RADIATION DOSE REDUCTION: This exam was performed according to the departmental dose-optimization program which includes automated exposure control, adjustment of the mA and/or kV according to patient size and/or use of iterative reconstruction technique. CONTRAST:  75mL OMNIPAQUE IOHEXOL 300 MG/ML  SOLN COMPARISON:  Chest x-ray 05/08/2023. Abdomen and pelvis CT 03/13/2023. FINDINGS: Cardiovascular: Heart is nonenlarged. Trace pericardial effusion coronary artery calcifications are seen. Scattered vascular calcifications. Normal caliber aorta. Mediastinum/Nodes: Normal caliber thoracic esophagus. Preserved thyroid gland. No specific abnormal lymph node enlargement in the axillary region or left hilum. There are several  small lymph nodes identified in the mediastinum but more numerous than usually seen. There is also some clear enlarged right hilar nodes. Example series 2, image 69 laterally along the right hilum measuring 17 by 13 mm. Lungs/Pleura: Motion. No left-sided consolidation, pneumothorax or effusion. There is fluid collection a presumed enhancing wall new from the prior abdomen and pelvis CT of the right lung base posteriorly measuring 10.3 by 6.8 by 13.4 cm. This could be pleural in etiology with the adjacent parenchymal opacity or infiltrate in the right lower lobe. A component of atelectasis is possible as well. No right-sided pneumothorax no air in this collection. Possibilities include infection with the time course. Empyema would be 1 possibility. Upper Abdomen: Adrenal glands are preserved in the upper abdomen. Fatty liver infiltration. Musculoskeletal: Degenerative changes are seen along the spine IMPRESSION: Complex fluid collection developing in the right lung base measuring up to 13.4 x 10.3 x 6.8 cm with a presumed enhancing capsule. This very well could be pleural. There also parenchymal infiltrative opacity involving the adjacent lung. This was not seen on the CT scan of the abdomen pelvis from earlier in July 2024. Overall this could be an empyema or other infection. A component of pneumonia is possible as well. Please correlate with clinical presentation. Further workup as clinically appropriate. There are some enlarged mediastinal hilar nodes which could be reactive. Attention on follow-up Coronary artery calcifications. Please correlate for other coronary risk factors. Aortic Atherosclerosis (ICD10-I70.0). Electronically Signed   By: Karen Kays M.D.   On: 05/08/2023 12:08   DG Chest 2 View  Result Date: 05/08/2023 CLINICAL DATA:  Cough and pneumonia for 2 months.  Congestion. EXAM: CHEST - 2 VIEW COMPARISON:  03/13/2023. FINDINGS: Since the prior study, there is new complete collapse of the right  lung lower lobe. Findings are likely secondary to mucous plugging. Bronchoscopy is recommended. Bilateral lungs are otherwise clear. Bilateral lateral costophrenic angle are clear. Normal cardio-mediastinal silhouette. No acute osseous abnormalities. The soft tissues are within normal limits. IMPRESSION: *New complete collapse of the right lung lower lobe. Bronchoscopy is recommended. Electronically Signed   By: Jules Schick M.D.   On: 05/08/2023 08:43  Labs:  CBC: Recent Labs    05/08/23 1005 05/16/23 1825 05/17/23 0846 05/18/23 0329  WBC 19.4* 16.5* 15.8* 12.1*  HGB 10.2* 11.0* 10.6* 9.9*  HCT 31.5* 34.1* 31.5* 31.1*  PLT 726* 682* 665* 610*    COAGS: No results for input(s): "INR", "APTT" in the last 8760 hours.  BMP: Recent Labs    05/08/23 1005 05/16/23 1825 05/17/23 0846 05/18/23 0329  NA 133* 135 135 137  K 3.0* 2.8* 3.2* 4.1  CL 102 99 102 105  CO2 20* 25 22 24   GLUCOSE 120* 118* 106* 108*  BUN 11 9 5* 6  CALCIUM 8.4* 8.7* 8.4* 8.2*  CREATININE 0.61 0.57 0.51 0.52  GFRNONAA >60 >60 >60 >60    LIVER FUNCTION TESTS: Recent Labs    11/20/22 0942 03/13/23 1818 05/16/23 1825  BILITOT 0.6 1.2 0.3  AST 106* 30 43*  ALT 56* 23 34  ALKPHOS 171* 111 235*  PROT 6.6 7.5 7.6  ALBUMIN 4.0 3.7 2.2*    TUMOR MARKERS: No results for input(s): "AFPTM", "CEA", "CA199", "CHROMGRNA" in the last 8760 hours.  Assessment and Plan:  Sara Cummings is a 52 yo female being seen today in relation to right sided empyema. Patient has imaging revealing concern for possible empyema vs lung abscess on right lung and IR has been consulted for image-guided right chest tube placement. Case reviewed and approved by Dr Juliette Alcide. Case tentatively scheduled to proceed on 05/19/23 at 9 AM. Patient to be NPO at midnight.   Risks and benefits of chest tube placement were discussed with the patient including bleeding, infection, damage to adjacent structures, malfunction of the tube requiring  additional procedures and sepsis.  All of the patient's questions were answered, patient is agreeable to proceed. Consent signed and in chart.   Thank you for this interesting consult.  I greatly enjoyed meeting Sara Cummings and look forward to participating in their care.  A copy of this report was sent to the requesting provider on this date.  Electronically Signed: Kennieth Francois, PA-C 05/18/2023, 3:21 PM   I spent a total of 20 Minutes  in face to face in clinical consultation, greater than 50% of which was counseling/coordinating care for empyema.

## 2023-05-18 NOTE — Plan of Care (Signed)

## 2023-05-18 NOTE — Plan of Care (Signed)
  Problem: Clinical Measurements: Goal: Cardiovascular complication will be avoided Outcome: Progressing   

## 2023-05-18 NOTE — Progress Notes (Signed)
Progress Note   Patient: Sara Cummings GEX:[id number] DOB: 10-02-1970 DOA: 05/16/2023     2 DOS: the patient was seen and examined on 05/18/2023   Brief hospital course: Sara Cummings is a 52 y.o. female with medical history significant of HTN, HLD, sent from PCPs office for evaluation of right lung empyema/ lung abscess. Patient was diagnosed with pneumonia July 2nd and sent home with doxycycline. Patient declined admission 2 weeks ago when CT showed right LL abscess. Yesterday, patient went back to see PCP, xray showed persistent right lower lobe " collapsed and fluid" and so advised to come to hospital.   Assessment and Plan: RLL empyema vs lung abscess- Possible parapneumonic effusion. Pulmonology follow up appreciated possible chest tube placement by IR team.  Continue Unasyn therapy. WBC improving 12 today, ESR, CRP, platelets elevated. Follow sputum cultures.  Hypokalemia- Improved with oral potassium supplements. Monitor daily electrolytes.  Hypertension- BP lower side, hold Lisinopril. Will give 1L bolus 25o ml/ hr. Monitor BP closely.  Hyperlipidemia- Continue statin.  Anxiety disorder- Continue Klonopin.      Subjective: Patient is seen and examined today morning. She feels better, wishes to go home. Advised pulmonology follow up, incentive spirometry. Out of bed to chair.  Physical Exam: Vitals:   05/18/23 0341 05/18/23 0825 05/18/23 1104 05/18/23 1107  BP: (!) 95/59 93/62 93/63  (!) 97/59  Pulse: 72 75    Resp: 20 18    Temp: 99.2 F (37.3 C) 98.5 F (36.9 C)    TempSrc: Oral Oral    SpO2: 97% 97%    Weight:      Height:       General -  Middle aged Caucasian anxious female, no apparent distress HEENT - PERRLA, EOMI, atraumatic head, non tender sinuses. Lung - right sided decreased breath sounds, diffuse rhonchi Heart - S1, S2 heard, no murmurs, rubs, no pedal edema Neuro - Alert, awake and oriented x 3, non focal exam. Skin - Warm and dry. Data  Reviewed:     Latest Ref Rng & Units 05/18/2023    3:29 AM 05/17/2023    8:46 AM 05/16/2023    6:25 PM  CBC  WBC 4.0 - 10.5 K/uL 12.1  15.8  16.5   Hemoglobin 12.0 - 15.0 g/dL 9.9  01.0  27.2   Hematocrit 36.0 - 46.0 % 31.1  31.5  34.1   Platelets 150 - 400 K/uL 610  665  682       Latest Ref Rng & Units 05/18/2023    3:29 AM 05/17/2023    8:46 AM 05/16/2023    6:25 PM  BMP  Glucose 70 - 99 mg/dL 536  644  034   BUN 6 - 20 mg/dL 6  5  9    Creatinine 0.44 - 1.00 mg/dL 7.42  5.95  6.38   Sodium 135 - 145 mmol/L 137  135  135   Potassium 3.5 - 5.1 mmol/L 4.1  3.2  2.8   Chloride 98 - 111 mmol/L 105  102  99   CO2 22 - 32 mmol/L 24  22  25    Calcium 8.9 - 10.3 mg/dL 8.2  8.4  8.7      Family Communication: Patient understands and agrees with above plan. Lives alone.  Disposition: Status is: Inpatient Remains inpatient appropriate because: pulmonology work up of RLL abscess, chest tube placement.  Planned Discharge Destination: Home    Time spent: 42 minutes  Author: Marcelino Duster, MD 05/18/2023 4:13 PM  For  on call review [email or web address].

## 2023-05-18 NOTE — Progress Notes (Signed)
BP obtained with pulmonology in the room  05/18/23 1104 05/18/23 1107  Vitals  BP 93/63 (!) 97/59  MAP (mmHg) 72 72  BP Location Left Arm Left Arm  BP Method Automatic Automatic  Patient Position (if appropriate) Sitting Sitting  Pulse Rate Source Dinamap Dinamap  MEWS COLOR  MEWS Score Color Riki Sheer

## 2023-05-18 NOTE — TOC CM/SW Note (Signed)
Transition of Care [facility]) - Inpatient Brief Assessment   Patient Details  Name: Maaliyah Alloy MRN: [id number] Date of Birth: 1971/02/04  Transition of Care [facility]) CM/SW Contact:    Margarito Liner, LCSW Phone Number: 05/18/2023, 3:45 PM   Clinical Narrative: CSW has reviewed chart. No TOC needs identified at this time. CSW will continue to follow patient's progress. Please place [facility] consult if any needs arise.  Transition of Care Asessment: Insurance and Status: Insurance coverage has been reviewed Patient has primary care physician: Yes Home environment has been reviewed: Single family home Prior level of function:: Not documented Prior/Current Home Services: No current home services Social Determinants of Health Reivew: SDOH reviewed no interventions necessary Readmission risk has been reviewed: Yes Transition of care needs: no transition of care needs at this time

## 2023-05-19 ENCOUNTER — Inpatient Hospital Stay: Payer: BLUE CROSS/BLUE SHIELD

## 2023-05-19 DIAGNOSIS — J869 Pyothorax without fistula: Secondary | ICD-10-CM | POA: Diagnosis not present

## 2023-05-19 DIAGNOSIS — E876 Hypokalemia: Secondary | ICD-10-CM | POA: Diagnosis not present

## 2023-05-19 DIAGNOSIS — E782 Mixed hyperlipidemia: Secondary | ICD-10-CM | POA: Diagnosis not present

## 2023-05-19 DIAGNOSIS — I10 Essential (primary) hypertension: Secondary | ICD-10-CM | POA: Diagnosis not present

## 2023-05-19 LAB — CBC
HCT: 30.6 % — ABNORMAL LOW (ref 36.0–46.0)
Hemoglobin: 9.8 g/dL — ABNORMAL LOW (ref 12.0–15.0)
MCH: 30 pg (ref 26.0–34.0)
MCHC: 32 g/dL (ref 30.0–36.0)
MCV: 93.6 fL (ref 80.0–100.0)
Platelets: 583 10*3/uL — ABNORMAL HIGH (ref 150–400)
RBC: 3.27 MIL/uL — ABNORMAL LOW (ref 3.87–5.11)
RDW: 17.2 % — ABNORMAL HIGH (ref 11.5–15.5)
WBC: 13.4 10*3/uL — ABNORMAL HIGH (ref 4.0–10.5)
nRBC: 0 % (ref 0.0–0.2)

## 2023-05-19 MED ORDER — MIDAZOLAM HCL 2 MG/2ML IJ SOLN
INTRAMUSCULAR | Status: AC
  Filled 2023-05-19: qty 4

## 2023-05-19 MED ORDER — SODIUM CHLORIDE 0.9% FLUSH
10.0000 mL | Freq: Three times a day (TID) | INTRAVENOUS | Status: DC
  Administered 2023-05-19 – 2023-05-23 (×8): 10 mL via INTRAPLEURAL

## 2023-05-19 MED ORDER — LIDOCAINE 1 % OPTIME INJ - NO CHARGE
10.0000 mL | Freq: Once | INTRAMUSCULAR | Status: AC
  Administered 2023-05-19: 10 mL via SUBCUTANEOUS
  Filled 2023-05-19: qty 10

## 2023-05-19 MED ORDER — FENTANYL CITRATE (PF) 100 MCG/2ML IJ SOLN
INTRAMUSCULAR | Status: DC | PRN
Start: 2023-05-19 — End: 2023-05-23
  Administered 2023-05-19 (×2): 50 ug via INTRAVENOUS

## 2023-05-19 MED ORDER — SODIUM CHLORIDE (PF) 0.9 % IJ SOLN
10.0000 mg | Freq: Once | INTRAMUSCULAR | Status: AC
  Administered 2023-05-19: 10 mg via INTRAPLEURAL
  Filled 2023-05-19: qty 10

## 2023-05-19 MED ORDER — MIDAZOLAM HCL 2 MG/2ML IJ SOLN
INTRAMUSCULAR | Status: DC | PRN
Start: 2023-05-19 — End: 2023-05-23
  Administered 2023-05-19 (×2): 1 mg via INTRAVENOUS

## 2023-05-19 MED ORDER — FENTANYL CITRATE (PF) 100 MCG/2ML IJ SOLN
INTRAMUSCULAR | Status: AC
  Filled 2023-05-19: qty 2

## 2023-05-19 MED ORDER — STERILE WATER FOR INJECTION IJ SOLN
5.0000 mg | Freq: Once | RESPIRATORY_TRACT | Status: AC
  Administered 2023-05-19: 5 mg via INTRAPLEURAL
  Filled 2023-05-19: qty 5

## 2023-05-19 MED ORDER — MORPHINE SULFATE (PF) 2 MG/ML IV SOLN
2.0000 mg | INTRAVENOUS | Status: DC | PRN
  Administered 2023-05-19 (×2): 2 mg via INTRAVENOUS
  Filled 2023-05-19 (×3): qty 1

## 2023-05-19 NOTE — Procedures (Signed)
Interventional Radiology Procedure Note  Date of Procedure: 05/19/2023  Procedure: CT chest tube placement   Findings:  1. CT guided placement of right chest tube, 10Fr locking  2. 20ml purulent material aspirated and sent to lab for analysis    Complications: No immediate complications noted.   Estimated Blood Loss: minimal  Follow-up and Recommendations: 1. Management per pulmonology  2. IR remains available as needed    Olive Bass, MD  Vascular & Interventional Radiology  05/19/2023 10:29 AM

## 2023-05-19 NOTE — Progress Notes (Signed)
Date of Procedure: 05/19/2023  Procedure: CT chest tube placement with DORNASE AND ALTEPLASE  THERAPY   Findings:  1. CT guided placement of right chest tube, 10Fr locking  2. 20ml purulent material aspirated and sent to lab for analysis   3. 130 CC purulent drainage output  Written consent obtained Risk of bleeding and pain explained to patient Consent form signed with nursing witness   Chest tube suction turned off I instilled 10 mg of Alteplase and 5 mg of Dornase into chest tube. I have advised patient to move around if possible and take deep breaths We will plan to dwell for 1 hour and then place back to suction     Complications: No immediate complications noted.    Estimated Blood Loss: none   Will plan for therapy for next 3 days and re-assess with CT chest  Lucie Leather, M.D.  [facility]  Medical Director [facility] Medical Director [facility] Cardio-Pulmonary Department

## 2023-05-19 NOTE — Plan of Care (Signed)

## 2023-05-19 NOTE — Progress Notes (Signed)
  Progress Note   Patient: Sara Cummings JYN:[id number] DOB: 1971-05-16 DOA: 05/16/2023     3 DOS: the patient was seen and examined on 05/19/2023   Brief hospital course: Sara Cummings is a 52 y.o. female with medical history significant of HTN, HLD, sent from PCPs office for evaluation of right lung empyema/ lung abscess. Patient was diagnosed with pneumonia July 2nd and sent home with doxycycline. Patient declined admission 2 weeks ago when CT showed right LL abscess. Yesterday, patient went back to see PCP, xray showed persistent right lower lobe " collapsed and fluid" and so advised to come to hospital.   Assessment and Plan: RLL empyema vs lung abscess- Possible parapneumonic effusion. S/p left chest tube placement by IR team. 20ml purulent fluid drained, sent for cultures. Continue Unasyn therapy. Pulmonology team follow up appreciated. Chest tube management per pulmonology team.  Hypokalemia- Improved with oral potassium supplements. Monitor daily electrolytes.  Hypertension- BP lower side, hold Lisinopril. Monitor BP closely.  Hyperlipidemia- Continue statin.  Anxiety disorder- Continue Klonopin as needed.      Subjective: Patient is seen and examined today after chest tube placement. She denies any pain, feels better. No overnight issues.  Physical Exam: Vitals:   05/19/23 1025 05/19/23 1030 05/19/23 1045 05/19/23 1643  BP: 109/69 111/65 118/68 (!) 92/58  Pulse: 72 70 73 75  Resp:    18  Temp:   98.8 F (37.1 C) 98.8 F (37.1 C)  TempSrc:   Oral Oral  SpO2: 92% 94% 98% 95%  Weight:      Height:       General -  Middle aged Caucasian anxious female, no apparent distress HEENT - PERRLA, EOMI, atraumatic head, non tender sinuses. Lung - right sided decreased breath sounds, right chest tube noted. Heart - S1, S2 heard, no murmurs, rubs, no pedal edema Neuro - Alert, awake and oriented x 3, non focal exam. Skin - Warm and dry. Data Reviewed:     Latest Ref  Rng & Units 05/19/2023    6:47 AM 05/18/2023    3:29 AM 05/17/2023    8:46 AM  CBC  WBC 4.0 - 10.5 K/uL 13.4  12.1  15.8   Hemoglobin 12.0 - 15.0 g/dL 9.8  9.9  86.5   Hematocrit 36.0 - 46.0 % 30.6  31.1  31.5   Platelets 150 - 400 K/uL 583  610  665       Latest Ref Rng & Units 05/18/2023    3:29 AM 05/17/2023    8:46 AM 05/16/2023    6:25 PM  BMP  Glucose 70 - 99 mg/dL 784  696  295   BUN 6 - 20 mg/dL 6  5  9    Creatinine 0.44 - 1.00 mg/dL 2.84  1.32  4.40   Sodium 135 - 145 mmol/L 137  135  135   Potassium 3.5 - 5.1 mmol/L 4.1  3.2  2.8   Chloride 98 - 111 mmol/L 105  102  99   CO2 22 - 32 mmol/L 24  22  25    Calcium 8.9 - 10.3 mg/dL 8.2  8.4  8.7      Family Communication: Patient understands and agrees with above plan. Lives alone.  Disposition: Status is: Inpatient Remains inpatient appropriate because: right chest tube management, IV antibiotics.  Planned Discharge Destination: Home    Time spent: 42 minutes  Author: Marcelino Duster, MD 05/19/2023 4:55 PM  For on call review [email or web address].

## 2023-05-20 ENCOUNTER — Inpatient Hospital Stay: Payer: BLUE CROSS/BLUE SHIELD

## 2023-05-20 DIAGNOSIS — I10 Essential (primary) hypertension: Secondary | ICD-10-CM | POA: Diagnosis not present

## 2023-05-20 DIAGNOSIS — E782 Mixed hyperlipidemia: Secondary | ICD-10-CM | POA: Diagnosis not present

## 2023-05-20 DIAGNOSIS — J869 Pyothorax without fistula: Secondary | ICD-10-CM | POA: Diagnosis not present

## 2023-05-20 DIAGNOSIS — E876 Hypokalemia: Secondary | ICD-10-CM | POA: Diagnosis not present

## 2023-05-20 LAB — CBC
HCT: 31.5 % — ABNORMAL LOW (ref 36.0–46.0)
Hemoglobin: 10 g/dL — ABNORMAL LOW (ref 12.0–15.0)
MCH: 29.6 pg (ref 26.0–34.0)
MCHC: 31.7 g/dL (ref 30.0–36.0)
MCV: 93.2 fL (ref 80.0–100.0)
Platelets: 559 10*3/uL — ABNORMAL HIGH (ref 150–400)
RBC: 3.38 MIL/uL — ABNORMAL LOW (ref 3.87–5.11)
RDW: 17.3 % — ABNORMAL HIGH (ref 11.5–15.5)
WBC: 10.2 10*3/uL (ref 4.0–10.5)
nRBC: 0 % (ref 0.0–0.2)

## 2023-05-20 LAB — CULTURE, RESPIRATORY W GRAM STAIN: Culture: NORMAL

## 2023-05-20 MED ORDER — METHYLPREDNISOLONE SODIUM SUCC 40 MG IJ SOLR
20.0000 mg | Freq: Two times a day (BID) | INTRAMUSCULAR | Status: DC
  Administered 2023-05-20 – 2023-05-23 (×6): 20 mg via INTRAVENOUS
  Filled 2023-05-20 (×6): qty 1

## 2023-05-20 MED ORDER — BUDESONIDE 0.5 MG/2ML IN SUSP
0.5000 mg | Freq: Two times a day (BID) | RESPIRATORY_TRACT | Status: DC
  Administered 2023-05-20 – 2023-05-23 (×6): 0.5 mg via RESPIRATORY_TRACT
  Filled 2023-05-20 (×6): qty 2

## 2023-05-20 MED ORDER — IPRATROPIUM-ALBUTEROL 0.5-2.5 (3) MG/3ML IN SOLN
3.0000 mL | RESPIRATORY_TRACT | Status: DC
  Administered 2023-05-20 – 2023-05-21 (×4): 3 mL via RESPIRATORY_TRACT
  Filled 2023-05-20 (×4): qty 3

## 2023-05-20 MED ORDER — SODIUM CHLORIDE (PF) 0.9 % IJ SOLN
10.0000 mg | Freq: Once | INTRAMUSCULAR | Status: AC
  Administered 2023-05-20: 10 mg via INTRAPLEURAL
  Filled 2023-05-20: qty 10

## 2023-05-20 MED ORDER — ALBUTEROL SULFATE (2.5 MG/3ML) 0.083% IN NEBU
3.0000 mL | INHALATION_SOLUTION | RESPIRATORY_TRACT | Status: DC | PRN
  Administered 2023-05-20: 3 mL via RESPIRATORY_TRACT
  Filled 2023-05-20: qty 3

## 2023-05-20 MED ORDER — SODIUM CHLORIDE 0.9% FLUSH
10.0000 mL | Freq: Three times a day (TID) | INTRAVENOUS | Status: DC
  Administered 2023-05-20 – 2023-05-23 (×6): 10 mL via INTRAPLEURAL

## 2023-05-20 MED ORDER — STERILE WATER FOR INJECTION IJ SOLN
5.0000 mg | Freq: Once | RESPIRATORY_TRACT | Status: AC
  Administered 2023-05-20: 5 mg via INTRAPLEURAL
  Filled 2023-05-20: qty 5

## 2023-05-20 NOTE — Progress Notes (Signed)
  Progress Note   Patient: Sara Cummings HYQ:[id number] DOB: February 10, 1971 DOA: 05/16/2023     4 DOS: the patient was seen and examined on 05/20/2023   Brief hospital course: Sara Cummings is a 52 y.o. female with medical history significant of HTN, HLD, sent from PCPs office for evaluation of right lung empyema/ lung abscess. Patient was diagnosed with pneumonia July 2nd and sent home with doxycycline. Patient declined admission 2 weeks ago when CT showed right LL abscess. Yesterday, patient went back to see PCP, xray showed persistent right lower lobe " collapsed and fluid" and so advised to come to hospital.   Assessment and Plan: RLL empyema vs lung abscess- Possible parapneumonic effusion. S/p left chest tube placement by IR team. Continue Unasyn therapy. WBC normal today. Follow aspirate cultures. Chest tube management per pulmonology.  Hypokalemia- Improved with oral potassium supplements.  Hypertension- BP persistently lower side, hold Lisinopril. Monitor BP closely.  Hyperlipidemia- Continue statin.  Anxiety disorder- Continue Klonopin.      Subjective: Patient is seen and examined today morning. She has no shortness of breath, feels improved. Mother at bedside. Advised out of bed to chair.  Physical Exam: Vitals:   05/19/23 1643 05/19/23 1917 05/20/23 0509 05/20/23 0847  BP: (!) 92/58 104/61 104/63 98/60  Pulse: 75 77 73 73  Resp: 18 16 18 18   Temp: 98.8 F (37.1 C) 98.4 F (36.9 C) 98.5 F (36.9 C) 97.8 F (36.6 C)  TempSrc: Oral Oral Oral Oral  SpO2: 95% 94% 95% 95%  Weight:      Height:       General -  Middle aged Caucasian female, no apparent distress HEENT - PERRLA, EOMI, atraumatic head, non tender sinuses. Lung - right sided decreased breath sounds, diffuse rhonchi Heart - S1, S2 heard, no murmurs, rubs, no pedal edema Neuro - Alert, awake and oriented x 3, non focal exam. Skin - Warm and dry. Data Reviewed:     Latest Ref Rng & Units 05/20/2023     5:50 AM 05/19/2023    6:47 AM 05/18/2023    3:29 AM  CBC  WBC 4.0 - 10.5 K/uL 10.2  13.4  12.1   Hemoglobin 12.0 - 15.0 g/dL 95.2  9.8  9.9   Hematocrit 36.0 - 46.0 % 31.5  30.6  31.1   Platelets 150 - 400 K/uL 559  583  610       Latest Ref Rng & Units 05/18/2023    3:29 AM 05/17/2023    8:46 AM 05/16/2023    6:25 PM  BMP  Glucose 70 - 99 mg/dL 841  324  401   BUN 6 - 20 mg/dL 6  5  9    Creatinine 0.44 - 1.00 mg/dL 0.27  2.53  6.64   Sodium 135 - 145 mmol/L 137  135  135   Potassium 3.5 - 5.1 mmol/L 4.1  3.2  2.8   Chloride 98 - 111 mmol/L 105  102  99   CO2 22 - 32 mmol/L 24  22  25    Calcium 8.9 - 10.3 mg/dL 8.2  8.4  8.7      Family Communication: Patient understands and agrees with above plan. Lives alone.  Disposition: Status is: Inpatient Remains inpatient appropriate because: pulmonology work up of RLL abscess, chest tube placement.  Planned Discharge Destination: Home    Time spent: 40 minutes  Author: Marcelino Duster, MD 05/20/2023 1:43 PM  For on call review [email or web address].

## 2023-05-20 NOTE — Plan of Care (Signed)
  Problem: Education: Goal: Knowledge of General Education information will improve Description: Including pain rating scale, medication(s)/side effects and non-pharmacologic comfort measures Outcome: Progressing   Problem: Clinical Measurements: Goal: Ability to maintain clinical measurements within normal limits will improve Outcome: Progressing Goal: Will remain free from infection Outcome: Progressing Goal: Diagnostic test results will improve Outcome: Progressing Goal: Respiratory complications will improve Outcome: Progressing Goal: Cardiovascular complication will be avoided Outcome: Progressing   Problem: Nutrition: Goal: Adequate nutrition will be maintained Outcome: Progressing   Problem: Elimination: Goal: Will not experience complications related to bowel motility Outcome: Progressing Goal: Will not experience complications related to urinary retention Outcome: Progressing   Problem: Pain Managment: Goal: General experience of comfort will improve Outcome: Progressing   Problem: Safety: Goal: Ability to remain free from injury will improve Outcome: Progressing

## 2023-05-20 NOTE — Progress Notes (Signed)
    CHIEF COMPLAINT:   COPD exacerbation Lung abscess    Subjective  Feeling OK        Review of Systems: Gen:  Denies  fever, sweats, chills weight loss  HEENT: Denies blurred vision, double vision, ear pain, eye pain, hearing loss, nose bleeds, sore throat Cardiac:  No dizziness, chest pain or heaviness, chest tightness,edema, No JVD Resp:   No cough, -sputum production, -shortness of breath,-wheezing, -hemoptysis,  Other:  All other systems negative   Physical Examination:   General Appearance: No distress  EYES PERRLA, EOM intact.   NECK Supple, No JVD Pulmonary: normal breath sounds, No wheezing.  CardiovascularNormal S1,S2.  No m/r/g.   Abdomen: Benign, Soft, non-tender. Neurology UE/LE 5/5 strength, no focal deficits Ext pulses intact, cap refill intact ALL OTHER ROS ARE NEGATIVE     VITALS:  height is 5\' 10"  (1.778 m) and weight is 83.9 kg. Her temperature is 98.1 F (36.7 C). Her blood pressure is 103/66 and her pulse is 81. Her respiration is 16 and oxygen saturation is 93%.   I personally reviewed Labs under Results section.  Radiology Reports DG Chest Port 1 View  Result Date: 05/20/2023 CLINICAL DATA:  Chest tube. EXAM: PORTABLE CHEST 1 VIEW COMPARISON:  May 15, 2023. FINDINGS: The heart size and mediastinal contours are within normal limits. Stable position of right-sided chest tube without pneumothorax. Right basilar opacity is slightly decreased in size compared to prior exam suggesting smaller abscess or empyema. Right basilar atelectasis is noted. Left lung is unremarkable. The visualized skeletal structures are unremarkable. IMPRESSION: Stable position of right-sided chest tube. Continued atelectasis right lower lobe. Slightly decreased right lower lobe opacity is noted suggesting improving empyema or abscess. Electronically Signed   By: Lupita Raider M.D.   On: 05/20/2023 11:33       Assessment/Plan:  RT LUNG ABSCESS WITH COPD  EXACERBATION    Procedure: CT chest tube placement with DORNASE AND ALTEPLASE  THERAPY  9/7 Written consent obtained Risk of bleeding and pain explained to patient Consent form signed with nursing witness 1. CT guided placement of right chest tube, 10Fr locking  2. 20ml purulent material aspirated and sent to lab for analysis   3. 130 CC purulent drainage output  Chest tube suction turned off I instilled 10 mg of Alteplase and 5 mg of Dornase into chest tube. I have advised patient to move around if possible and take deep breaths We will plan to dwell for 1 hour and then place back to suction   9/8 80 cc's of purulent drainage from chest tube GPC's in pleural cultures I instilled 10 mg of Alteplase and 5 mg of Dornase into chest tube. I have advised patient to move around if possible and take deep breaths Patient placed back on suction after 1 hour  Therapy for COPD exacerbation to be started Dounebs every 4 hrs Pulmicort nebs Solumedrol 20 bid  Complications: No immediate complications noted.    Estimated Blood Loss: none   Will plan for therapy for one more day  and re-assess with CT chest  Lucie Leather, M.D.  [facility]  Medical Director [facility] Medical Director [facility] Cardio-Pulmonary Department

## 2023-05-21 DIAGNOSIS — J869 Pyothorax without fistula: Secondary | ICD-10-CM | POA: Diagnosis not present

## 2023-05-21 DIAGNOSIS — E876 Hypokalemia: Secondary | ICD-10-CM | POA: Diagnosis not present

## 2023-05-21 DIAGNOSIS — E782 Mixed hyperlipidemia: Secondary | ICD-10-CM | POA: Diagnosis not present

## 2023-05-21 DIAGNOSIS — B955 Unspecified streptococcus as the cause of diseases classified elsewhere: Secondary | ICD-10-CM

## 2023-05-21 DIAGNOSIS — I10 Essential (primary) hypertension: Secondary | ICD-10-CM | POA: Diagnosis not present

## 2023-05-21 LAB — GLUCOSE, CAPILLARY: Glucose-Capillary: 149 mg/dL — ABNORMAL HIGH (ref 70–99)

## 2023-05-21 MED ORDER — SODIUM CHLORIDE (PF) 0.9 % IJ SOLN
10.0000 mg | Freq: Once | INTRAMUSCULAR | Status: AC
  Administered 2023-05-21: 10 mg via INTRAPLEURAL
  Filled 2023-05-21: qty 10

## 2023-05-21 MED ORDER — SODIUM CHLORIDE 0.9% FLUSH
10.0000 mL | Freq: Three times a day (TID) | INTRAVENOUS | Status: DC
  Administered 2023-05-21 – 2023-05-22 (×3): 10 mL via INTRAPLEURAL

## 2023-05-21 MED ORDER — IPRATROPIUM-ALBUTEROL 0.5-2.5 (3) MG/3ML IN SOLN
3.0000 mL | Freq: Two times a day (BID) | RESPIRATORY_TRACT | Status: DC
  Administered 2023-05-21 – 2023-05-23 (×4): 3 mL via RESPIRATORY_TRACT
  Filled 2023-05-21 (×4): qty 3

## 2023-05-21 MED ORDER — STERILE WATER FOR INJECTION IJ SOLN
5.0000 mg | Freq: Once | RESPIRATORY_TRACT | Status: AC
  Administered 2023-05-21: 5 mg via INTRAPLEURAL
  Filled 2023-05-21: qty 5

## 2023-05-21 NOTE — Progress Notes (Addendum)
CHIEF COMPLAINT:   COPD exacerbation  Subjective  Feeling better   Review of Systems: Gen:  Denies  fever, sweats, chills weight loss  HEENT: Denies blurred vision, double vision, ear pain, eye pain, hearing loss, nose bleeds, sore throat Cardiac:  No dizziness, chest pain or heaviness, chest tightness,edema, No JVD Resp:   No cough, -sputum production, -shortness of breath,-wheezing, -hemoptysis,  Other:  All other systems negative   Physical Examination:   General Appearance: No distress  EYES PERRLA, EOM intact.   NECK Supple, No JVD Pulmonary: normal breath sounds, No wheezing.  CardiovascularNormal S1,S2.  No m/r/g.   Abdomen: Benign, Soft, non-tender. Neurology UE/LE 5/5 strength, no focal deficits Ext pulses intact, cap refill intact ALL OTHER ROS ARE NEGATIVE   VITALS:  height is 5\' 10"  (1.778 m) and weight is 83.9 kg. Her temperature is 98.1 F (36.7 C). Her blood pressure is 103/66 and her pulse is 81. Her respiration is 16 and oxygen saturation is 93%.   I personally reviewed Labs under Results section.  Radiology Reports DG Chest Port 1 View  Result Date: 05/20/2023 CLINICAL DATA:  Chest tube. EXAM: PORTABLE CHEST 1 VIEW COMPARISON:  May 15, 2023. FINDINGS: The heart size and mediastinal contours are within normal limits. Stable position of right-sided chest tube without pneumothorax. Right basilar opacity is slightly decreased in size compared to prior exam suggesting smaller abscess or empyema. Right basilar atelectasis is noted. Left lung is unremarkable. The visualized skeletal structures are unremarkable. IMPRESSION: Stable position of right-sided chest tube. Continued atelectasis right lower lobe. Slightly decreased right lower lobe opacity is noted suggesting improving empyema or abscess. Electronically Signed   By: Lupita Raider M.D.   On: 05/20/2023 11:33       Assessment/Plan:  RT LUNG EMPYEMA AND LUNG ABSCESS WITH COPD  EXACERBATION   Procedure: CT chest tube placement with DORNASE AND ALTEPLASE  THERAPY  9/7 Written consent obtained for TPA and Dornase Therapy Risk of bleeding and pain explained to patient Consent form signed with nursing witness -CT guided placement of right chest tube, 10Fr locking  - 20ml purulent material aspirated and sent to lab for analysis   -130 CC purulent drainage output  Chest tube suction turned off I instilled 10 mg of Alteplase and 5 mg of Dornase into chest tube. I have advised patient to move around if possible and take deep breaths We will plan to dwell for 1 hour and then place back to suction   9/8 80 cc's of purulent drainage from chest tube GPC's in pleural cultures I instilled 10 mg of Alteplase and 5 mg of Dornase into chest tube. I have advised patient to move around if possible and take deep breaths Patient placed back on suction after 1 hour    9/9 75 cc's of purulent/bloody  drainage from chest tube GPC's in pleural cultures STREPTOCOCCUS SPECIES ID consulted I instilled 10 mg of Alteplase and 5 mg of Dornase into chest tube. I have advised patient to move around if possible and take deep breaths and walk around    Therapy for COPD exacerbation to be started Dounebs every 4 hrs Pulmicort nebs Solumedrol 20 bid  Complications: No immediate complications noted.       Will plan for therapy for today and re-assess with CT chest in next 24 hrs  Roselene Gray Santiago Glad, M.D.  [facility]  Medical Director [facility] Medical Director [facility] Cardio-Pulmonary Department

## 2023-05-21 NOTE — Consult Note (Signed)
[facility]  Total days of antibiotics 5       Reason for Consult:streptococcal lung abscess/empyema   Referring Physician: kasa  Principal Problem:   Empyema (HCC)    HPI: Sara Cummings is a 52 y.o. female with hx of COPD, HLD, depression reports that she was treated with 5 day course of levofloxacin and steroids for CAP in early July, initially felt better but then would develop intermittent nightsweats and cough. She presented to the ED on 8/27 for evaluation. At that time, the imaging did show new fluid collection with enhancing capsule measuring 13 x 10 x 7 cm. Concerning for empyema. The patient at that time did not want to be admitted but rather have oral abtx. The ED prescribed a 7 day course of levofloxacin 750mg  BID! She was seen in follow up in the pulm clinic on 9/3 for which she was now convinced to being admitted for management of empyema.her labs are sginficant for leukocytosis of 16k and elevated 665. She was started on amp./sub and pig tail catheter placed. Only < 250-300 mL draining. She denies fever. Has had 3 doses of TPA. Denies chest pain.infectious work up -empyema fluid = + strep intermedius  Past Medical History:  Diagnosis Date   Anxiety    Hypercholesterolemia    Hypertension     Allergies:  Allergies  Allergen Reactions   Erythromycin Other (See Comments)    As a child, unsure reaction     MEDICATIONS:  alteplase (CATHFLO ACTIVASE) 10 mg in sodium chloride (PF) 0.9 % 30 mL  10 mg Intrapleural Once   And   dornase alfa (PULMOZYME) 5 mg in sterile water (preservative free) 30 mL  5 mg Intrapleural Once   budesonide (PULMICORT) nebulizer solution  0.5 mg Nebulization BID   buPROPion  150 mg Oral BID   enoxaparin (LOVENOX) injection  40 mg Subcutaneous Q24H   guaiFENesin  1,200 mg Oral BID   ipratropium-albuterol  3 mL Nebulization BID   methylPREDNISolone (SOLU-MEDROL) injection  20 mg Intravenous BID   pantoprazole  40 mg  Oral Daily   rosuvastatin  20 mg Oral Daily   sodium chloride flush  10 mL Intrapleural Q8H   sodium chloride flush  10 mL Intrapleural Q8H   sodium chloride flush  10 mL Intrapleural Q8H    Social History   Tobacco Use   Smoking status: Every Day    Current packs/day: 1.00    Types: Cigarettes   Smokeless tobacco: Never  Vaping Use   Vaping status: Never Used  Substance Use Topics   Alcohol use: Yes    Comment: occ.   Drug use: No    Family History  Problem Relation Age of Onset   Lung cancer Father 42   Hypertension Mother    Heart disease Maternal Grandmother      Review of Systems  Constitutional: Negative for fever, chills, diaphoresis, activity change, appetite change, fatigue and unexpected weight change.  HENT: Negative for congestion, sore throat, rhinorrhea, sneezing, trouble swallowing and sinus pressure.  Eyes: Negative for photophobia and visual disturbance.  Respiratory: Negative for cough, chest tightness, shortness of breath, wheezing and stridor.  Cardiovascular: Negative for chest pain, palpitations and leg swelling.  Gastrointestinal: Negative for nausea, vomiting, abdominal pain, diarrhea, constipation, blood in stool, abdominal distention and anal bleeding.  Genitourinary: Negative for dysuria, hematuria, flank pain and difficulty urinating.  Musculoskeletal: Negative for myalgias, back pain, joint swelling, arthralgias and gait problem.  Skin: Negative for color change, pallor, rash and wound.  Neurological: Negative for dizziness, tremors, weakness and light-headedness.  Hematological: Negative for adenopathy. Does not bruise/bleed easily.  Psychiatric/Behavioral: Negative for behavioral problems, confusion, sleep disturbance, dysphoric mood, decreased concentration and agitation.     OBJECTIVE: Temp:  [97.9 F (36.6 C)-99.1 F (37.3 C)] 98.1 F (36.7 C) (09/09 0813) Pulse Rate:  [78-82] 81 (09/09 0813) Resp:  [16-18] 16 (09/09 0813) BP:  (103-105)/(66-68) 103/66 (09/09 0813) SpO2:  [91 %-96 %] 93 % (09/09 0813) Physical Exam  Constitutional:  oriented to person, place, and time. appears well-developed and well-nourished. No distress.  HENT: Freer/AT, PERRLA, no scleral icterus Mouth/Throat: Oropharynx is clear and moist. No oropharyngeal exudate.  Cardiovascular: Normal rate, regular rhythm and normal heart sounds. Exam reveals no gallop and no friction rub.  No murmur heard.  Pulmonary/Chest: Effort normal and breath sounds normal. No respiratory distress.  has no wheezes. Decrease breath sounds to right base. Pig tail catheter Neck = supple, no nuchal rigidity Abdominal: Soft. Bowel sounds are normal.  exhibits no distension. There is no tenderness.  Lymphadenopathy: no cervical adenopathy. No axillary adenopathy Neurological: alert and oriented to person, place, and time.  Skin: Skin is warm and dry. No rash noted. No erythema.  Psychiatric: a normal mood and affect.  behavior is normal.    LABS: Results for orders placed or performed during the hospital encounter of 05/16/23 (from the past 48 hour(s))  CBC     Status: Abnormal   Collection Time: 05/20/23  5:50 AM  Result Value Ref Range   WBC 10.2 4.0 - 10.5 K/uL   RBC 3.38 (L) 3.87 - 5.11 MIL/uL   Hemoglobin 10.0 (L) 12.0 - 15.0 g/dL   HCT 16.1 (L) 09.6 - 04.5 %   MCV 93.2 80.0 - 100.0 fL   MCH 29.6 26.0 - 34.0 pg   MCHC 31.7 30.0 - 36.0 g/dL   RDW 40.9 (H) 81.1 - 91.4 %   Platelets 559 (H) 150 - 400 K/uL   nRBC 0.0 0.0 - 0.2 %    Comment: Performed at [facility], [address], [place], [place] [address]    MICRO: Strep intermedius IMAGING: DG Chest Port 1 View  Result Date: 05/20/2023 CLINICAL DATA:  Chest tube. EXAM: PORTABLE CHEST 1 VIEW COMPARISON:  May 15, 2023. FINDINGS: The heart size and mediastinal contours are within normal limits. Stable position of right-sided chest tube without pneumothorax. Right basilar opacity is slightly  decreased in size compared to prior exam suggesting smaller abscess or empyema. Right basilar atelectasis is noted. Left lung is unremarkable. The visualized skeletal structures are unremarkable. IMPRESSION: Stable position of right-sided chest tube. Continued atelectasis right lower lobe. Slightly decreased right lower lobe opacity is noted suggesting improving empyema or abscess. Electronically Signed   By: Lupita Raider M.D.   On: 05/20/2023 11:33     Assessment/Plan:  51yo F with large right sided empyema, thus far strep intermedius identified. Still insufficient drainage after 3 days of TPA.  - may need consider larger bore chest tube vs. Evaluation for VATS. Suspect that this has been smoldering and developed thick rind that may need more advanced intervention. - continue on amp/sub - will follow culture results Leukocytosis improving  -health maintenance = recommend flu, covid and pneumonia vaccine prior to discharge. Recommend to check for HCV

## 2023-05-21 NOTE — Progress Notes (Signed)
Progress Note   Patient: Sara Cummings YQM:[id number] DOB: Sep 26, 1970 DOA: 05/16/2023     5 DOS: the patient was seen and examined on 05/21/2023   Brief hospital course: Sara Cummings is a 52 y.o. female with medical history significant of HTN, HLD, sent from PCPs office for evaluation of right lung empyema/ lung abscess. Patient was diagnosed with pneumonia July 2nd and sent home with doxycycline. Patient declined admission 2 weeks ago when CT showed right LL abscess. Yesterday, patient went back to see PCP, xray showed persistent right lower lobe " collapsed and fluid" and so advised to come to hospital.   Assessment and Plan: RLL empyema vs lung abscess- Possible parapneumonic effusion. S/p left chest tube placement by IR team. Continue Unasyn therapy. WBC normal today. Follow aspirate cultures. She got Alteplase therapy through chest tube, repeat chest xray shows improving right lower lobe collection. Repeat Alteplase therapy for another day and repeat CT chest planned per pulmonology.  Hypokalemia- Improved with oral potassium supplements.  Hypertension- BP persistently lower side, hold Lisinopril. Monitor BP closely.  Hyperlipidemia- Continue statin.  Anxiety disorder- Continue Klonopin.      Subjective: Patient is seen and examined today morning. She has complaints. Mother at bedside. Able to get out of bed. No overnight issues.  Physical Exam: Vitals:   05/21/23 0356 05/21/23 0453 05/21/23 0727 05/21/23 0813  BP:  104/67  103/66  Pulse:  78  81  Resp:  18  16  Temp:  97.9 F (36.6 C)  98.1 F (36.7 C)  TempSrc:  Oral    SpO2: 92% 96% 93% 93%  Weight:      Height:       General -  Middle aged Caucasian female, no apparent distress HEENT - PERRLA, EOMI, atraumatic head, non tender sinuses. Lung - right sided decreased breath, chest tube to suction noted. Heart - S1, S2 heard, no murmurs, rubs, no pedal edema Neuro - Alert, awake and oriented x 3, non focal  exam. Skin - Warm and dry. Data Reviewed:     Latest Ref Rng & Units 05/20/2023    5:50 AM 05/19/2023    6:47 AM 05/18/2023    3:29 AM  CBC  WBC 4.0 - 10.5 K/uL 10.2  13.4  12.1   Hemoglobin 12.0 - 15.0 g/dL 52.8  9.8  9.9   Hematocrit 36.0 - 46.0 % 31.5  30.6  31.1   Platelets 150 - 400 K/uL 559  583  610       Latest Ref Rng & Units 05/18/2023    3:29 AM 05/17/2023    8:46 AM 05/16/2023    6:25 PM  BMP  Glucose 70 - 99 mg/dL 413  244  010   BUN 6 - 20 mg/dL 6  5  9    Creatinine 0.44 - 1.00 mg/dL 2.72  5.36  6.44   Sodium 135 - 145 mmol/L 137  135  135   Potassium 3.5 - 5.1 mmol/L 4.1  3.2  2.8   Chloride 98 - 111 mmol/L 105  102  99   CO2 22 - 32 mmol/L 24  22  25    Calcium 8.9 - 10.3 mg/dL 8.2  8.4  8.7      Family Communication: Patient understands and agrees with above plan. Lives alone.  Disposition: Status is: Inpatient Remains inpatient appropriate because: chest tube management, IV antibiotics.  Planned Discharge Destination: Home    Time spent: 38 minutes  Author: Marcelino Duster, MD 05/21/2023 1:46  PM  For on call review [email or web address].

## 2023-05-22 ENCOUNTER — Inpatient Hospital Stay: Payer: BLUE CROSS/BLUE SHIELD

## 2023-05-22 DIAGNOSIS — J441 Chronic obstructive pulmonary disease with (acute) exacerbation: Secondary | ICD-10-CM

## 2023-05-22 DIAGNOSIS — J869 Pyothorax without fistula: Secondary | ICD-10-CM | POA: Diagnosis not present

## 2023-05-22 DIAGNOSIS — E782 Mixed hyperlipidemia: Secondary | ICD-10-CM | POA: Diagnosis not present

## 2023-05-22 DIAGNOSIS — I10 Essential (primary) hypertension: Secondary | ICD-10-CM | POA: Diagnosis not present

## 2023-05-22 DIAGNOSIS — B955 Unspecified streptococcus as the cause of diseases classified elsewhere: Secondary | ICD-10-CM | POA: Diagnosis not present

## 2023-05-22 DIAGNOSIS — E876 Hypokalemia: Secondary | ICD-10-CM | POA: Diagnosis not present

## 2023-05-22 LAB — BASIC METABOLIC PANEL
Anion gap: 9 (ref 5–15)
BUN: 11 mg/dL (ref 6–20)
CO2: 24 mmol/L (ref 22–32)
Calcium: 8.8 mg/dL — ABNORMAL LOW (ref 8.9–10.3)
Chloride: 103 mmol/L (ref 98–111)
Creatinine, Ser: 0.63 mg/dL (ref 0.44–1.00)
GFR, Estimated: >60 mL/min (ref >60)
Glucose, Bld: 152 mg/dL — ABNORMAL HIGH (ref 70–99)
Potassium: 3.9 mmol/L (ref 3.5–5.1)
Sodium: 136 mmol/L (ref 135–145)

## 2023-05-22 LAB — PHOSPHORUS: Phosphorus: 3.5 mg/dL (ref 2.5–4.6)

## 2023-05-22 LAB — CBC
HCT: 37.5 % (ref 36.0–46.0)
Hemoglobin: 11.6 g/dL — ABNORMAL LOW (ref 12.0–15.0)
MCH: 29.4 pg (ref 26.0–34.0)
MCHC: 30.9 g/dL (ref 30.0–36.0)
MCV: 94.9 fL (ref 80.0–100.0)
Platelets: 647 10*3/uL — ABNORMAL HIGH (ref 150–400)
RBC: 3.95 MIL/uL (ref 3.87–5.11)
RDW: 17.4 % — ABNORMAL HIGH (ref 11.5–15.5)
WBC: 17.4 10*3/uL — ABNORMAL HIGH (ref 4.0–10.5)
nRBC: 0 % (ref 0.0–0.2)

## 2023-05-22 LAB — MAGNESIUM: Magnesium: 2.1 mg/dL (ref 1.7–2.4)

## 2023-05-22 MED ORDER — MELATONIN 5 MG PO TABS: 10.0000 mg | ORAL_TABLET | Freq: Every evening | ORAL | Status: DC | PRN

## 2023-05-22 NOTE — Progress Notes (Signed)
Progress Note   Patient: Sara Cummings YQM:[id number] DOB: 1971-04-07 DOA: 05/16/2023     6 DOS: the patient was seen and examined on 05/22/2023   Brief hospital course: Suzannah Durnil is a 52 y.o. female with medical history significant of HTN, HLD, sent from PCPs office for evaluation of right lung empyema/ lung abscess. Patient was diagnosed with pneumonia July 2nd and sent home with doxycycline. Patient declined admission 2 weeks ago when CT showed right LL abscess. Yesterday, patient went back to see PCP, xray showed persistent right lower lobe " collapsed and fluid" and so advised to come to hospital.   Assessment and Plan: RLL empyema vs lung abscess- Possible parapneumonic effusion. S/p left chest tube placement by IR team. Cultures grew abundant gam positive cocci. She got Alteplase therapy through chest tube, with 80ml output. Repeat CT chest shows improvement of lung abscess. Continue Unasyn therapy. Possible chest tube removal and dc home tomorrow.  COPD exacerbation- Started IV solumedrol therapy. Duonebs PRN. Pulmicort nebs. WBC trended up from steroids.  Hypokalemia- Improved with oral potassium supplements.  Hypertension- BP persistently lower side, hold Lisinopril. Monitor BP closely.  Hyperlipidemia- Continue statin.  Anxiety disorder- Continue Klonopin.      Subjective: Patient is seen and examined today morning. Denies any complaints. Mother at bedside. Able to get out of bed. No overnight issues.  Physical Exam: Vitals:   05/21/23 1937 05/22/23 0319 05/22/23 0749 05/22/23 0855  BP: 110/68 115/74  115/77  Pulse: 82 72  84  Resp: 16 16  16   Temp: 98.2 F (36.8 C) 97.6 F (36.4 C)  98.2 F (36.8 C)  TempSrc:      SpO2: 94% 95% 96% 96%  Weight:      Height:       General -  Middle aged Caucasian female, no apparent distress HEENT - PERRLA, EOMI, atraumatic head, non tender sinuses. Lung - right sided decreased breath, chest tube to suction  noted. Heart - S1, S2 heard, no murmurs, rubs, no pedal edema Neuro - Alert, awake and oriented x 3, non focal exam. Skin - Warm and dry. Data Reviewed:     Latest Ref Rng & Units 05/22/2023    6:44 AM 05/20/2023    5:50 AM 05/19/2023    6:47 AM  CBC  WBC 4.0 - 10.5 K/uL 17.4  10.2  13.4   Hemoglobin 12.0 - 15.0 g/dL 52.8  41.3  9.8   Hematocrit 36.0 - 46.0 % 37.5  31.5  30.6   Platelets 150 - 400 K/uL 647  559  583       Latest Ref Rng & Units 05/22/2023    6:44 AM 05/18/2023    3:29 AM 05/17/2023    8:46 AM  BMP  Glucose 70 - 99 mg/dL 244  010  272   BUN 6 - 20 mg/dL 11  6  5    Creatinine 0.44 - 1.00 mg/dL 5.36  6.44  0.34   Sodium 135 - 145 mmol/L 136  137  135   Potassium 3.5 - 5.1 mmol/L 3.9  4.1  3.2   Chloride 98 - 111 mmol/L 103  105  102   CO2 22 - 32 mmol/L 24  24  22    Calcium 8.9 - 10.3 mg/dL 8.8  8.2  8.4      Family Communication: Patient and mother at bedside understand and agree with above plan.   Disposition: Status is: Inpatient Remains inpatient appropriate because: chest tube management, IV antibiotics.  Planned Discharge Destination: Home    Time spent: 38 minutes  Author: Marcelino Duster, MD 05/22/2023 3:50 PM  For on call review [email or web address].

## 2023-05-22 NOTE — Progress Notes (Signed)
[facility]    Date of Admission:  05/16/2023   Total days of antibiotics 6  ID: Sara Cummings is a 52 y.o. female with   Principal Problem:   Empyema (HCC)    Subjective: Afebrile. No discomfort with chest tube draining  Medications:   budesonide (PULMICORT) nebulizer solution  0.5 mg Nebulization BID   buPROPion  150 mg Oral BID   enoxaparin (LOVENOX) injection  40 mg Subcutaneous Q24H   guaiFENesin  1,200 mg Oral BID   ipratropium-albuterol  3 mL Nebulization BID   methylPREDNISolone (SOLU-MEDROL) injection  20 mg Intravenous BID   pantoprazole  40 mg Oral Daily   rosuvastatin  20 mg Oral Daily   sodium chloride flush  10 mL Intrapleural Q8H   sodium chloride flush  10 mL Intrapleural Q8H   sodium chloride flush  10 mL Intrapleural Q8H    Objective: Vital signs in last 24 hours: Temp:  [97.6 F (36.4 C)-98.2 F (36.8 C)] 98.2 F (36.8 C) (09/10 0855) Pulse Rate:  [72-85] 84 (09/10 0855) Resp:  [16] 16 (09/10 0855) BP: (109-115)/(58-77) 115/77 (09/10 0855) SpO2:  [93 %-96 %] 96 % (09/10 0855)  Physical Exam  Constitutional:  oriented to person, place, and time. appears well-developed and well-nourished. No distress.  HENT: [place]/AT, PERRLA, no scleral icterus Mouth/Throat: Oropharynx is clear and moist. No oropharyngeal exudate.  Cardiovascular: Normal rate, regular rhythm and normal heart sounds. Exam reveals no gallop and no friction rub.  No murmur heard.  Pulmonary/Chest: Effort normal and breath sounds normal. No respiratory distress.  has no wheezes. Decrease breath sounds at right base but improved from yesterday. Serosanginous fluid at mark in collection system Neck = supple, no nuchal rigidity Abdominal: Soft. Bowel sounds are normal.  exhibits no distension. There is no tenderness.  Lymphadenopathy: no cervical adenopathy. No axillary adenopathy Neurological: alert and oriented to person, place, and time.  Skin: Skin is warm  and dry. No rash noted. No erythema.  Psychiatric: a normal mood and affect.  behavior is normal.    Lab Results Recent Labs    05/20/23 0550 05/22/23 0644  WBC 10.2 17.4*  HGB 10.0* 11.6*  HCT 31.5* 37.5  NA  --  136  K  --  3.9  CL  --  103  CO2  --  24  BUN  --  11  CREATININE  --  0.63   Lab Results  Component Value Date   ESRSEDRATE 126 (H) 05/16/2023    Microbiology: Pending sensis Studies/Results: DG Chest Port 1 View  Result Date: 05/22/2023 CLINICAL DATA:  [id number] Chest tube in place [id number] EXAM: PORTABLE CHEST 1 VIEW COMPARISON:  CXR 05/20/23 FINDINGS: Right sided pleural pigtail drainage catheter appears inferomedially displaced, but still within the pleural space. No pleural effusion. Hazy opacity in the right lung base most likely represent atelectasis. Increased left basilar atelectasis. Unchanged cardiac mediastinal contours. No radiographically apparent displaced rib fracture. Visualized upper abdomen is unremarkable. IMPRESSION: Right sided pleural pigtail drainage catheter appears inferomedially displaced, but still within the pleural space. No pleural effusion. Improved aeration of the right lung base. Increased left basilar atelectasis. Electronically Signed   By: Lorenza Cambridge M.D.   On: 05/22/2023 10:38     Assessment/Plan: Right sided empyema = reviewed cxr in comparison to admit, appears much improved. Continue with amp/sub. May not need further TPa. Continue to monitor output. Awaiting sensitivites to strep intermedius to decide on oral abtx regimen  Leukocytosis =maybe due to steroids started recently. Will continue to monitor  [facility] Pager: [phone]  05/22/2023, 12:05 PM

## 2023-05-22 NOTE — Progress Notes (Signed)
CHIEF COMPLAINT:   COPD exacerbation/Lung abscess  Subjective  Feeling better  9/10  CT chest shows significant improvement of lung abscess Continue ABX as prescribed Plan to remove chest tube in 24 hrs      Review of Systems: Gen:  Denies  fever, sweats, chills weight loss  HEENT: Denies blurred vision, double vision, ear pain, eye pain, hearing loss, nose bleeds, sore throat Cardiac:  No dizziness, chest pain or heaviness, chest tightness,edema, No JVD Resp:   No cough, -sputum production, -shortness of breath,-wheezing, -hemoptysis,  Other:  All other systems negative   Physical Examination:   General Appearance: No distress  EYES PERRLA, EOM intact.   NECK Supple, No JVD Pulmonary: normal breath sounds, No wheezing.  CardiovascularNormal S1,S2.  No m/r/g.   Abdomen: Benign, Soft, non-tender. Neurology UE/LE 5/5 strength, no focal deficits Ext pulses intact, cap refill intact ALL OTHER ROS ARE NEGATIVE   VITALS:  height is 5\' 10"  (1.778 m) and weight is 83.9 kg. Her temperature is 98.2 F (36.8 C). Her blood pressure is 115/77 and her pulse is 84. Her respiration is 16 and oxygen saturation is 96%.   I personally reviewed Labs under Results section.  Radiology Reports DG Chest Port 1 View  Result Date: 05/22/2023 CLINICAL DATA:  [id number] Chest tube in place [id number] EXAM: PORTABLE CHEST 1 VIEW COMPARISON:  CXR 05/20/23 FINDINGS: Right sided pleural pigtail drainage catheter appears inferomedially displaced, but still within the pleural space. No pleural effusion. Hazy opacity in the right lung base most likely represent atelectasis. Increased left basilar atelectasis. Unchanged cardiac mediastinal contours. No radiographically apparent displaced rib fracture. Visualized upper abdomen is unremarkable. IMPRESSION: Right sided pleural pigtail drainage catheter appears inferomedially displaced, but still within the pleural space. No pleural effusion. Improved aeration of  the right lung base. Increased left basilar atelectasis. Electronically Signed   By: Lorenza Cambridge M.D.   On: 05/22/2023 10:38       Assessment/Plan:  RT LUNG EMPYEMA AND LUNG ABSCESS WITH COPD EXACERBATION   Procedure: CT chest tube placement with DORNASE AND ALTEPLASE  THERAPY  9/7 Written consent obtained for TPA and Dornase Therapy Risk of bleeding and pain explained to patient Consent form signed with nursing witness -CT guided placement of right chest tube, 10Fr locking  - 20ml purulent material aspirated and sent to lab for analysis   -130 CC purulent drainage output  Chest tube suction turned off I instilled 10 mg of Alteplase and 5 mg of Dornase into chest tube. I have advised patient to move around if possible and take deep breaths We will plan to dwell for 1 hour and then place back to suction   9/8 80 cc's of purulent drainage from chest tube GPC's in pleural cultures I instilled 10 mg of Alteplase and 5 mg of Dornase into chest tube. I have advised patient to move around if possible and take deep breaths Patient placed back on suction after 1 hour    9/9 75 cc's of purulent/bloody  drainage from chest tube GPC's in pleural cultures STREPTOCOCCUS SPECIES ID consulted I instilled 10 mg of Alteplase and 5 mg of Dornase into chest tube. I have advised patient to move around if possible and take deep breaths and walk around    Therapy for COPD exacerbation to be started Dounebs every 4 hrs Pulmicort nebs Solumedrol 20 bid    9/10 CT chest shows significant improvement of lung abscess Continue ABX as prescribed Plan to remove  chest tube in 24 hrs     Sara Cummings Sara Cummings, M.D.  [facility]  Medical Director [facility] Medical Director [facility] Cardio-Pulmonary Department

## 2023-05-22 NOTE — Plan of Care (Signed)

## 2023-05-23 ENCOUNTER — Inpatient Hospital Stay: Payer: BLUE CROSS/BLUE SHIELD

## 2023-05-23 ENCOUNTER — Other Ambulatory Visit: Payer: Self-pay

## 2023-05-23 DIAGNOSIS — E876 Hypokalemia: Secondary | ICD-10-CM | POA: Insufficient documentation

## 2023-05-23 DIAGNOSIS — J441 Chronic obstructive pulmonary disease with (acute) exacerbation: Secondary | ICD-10-CM | POA: Diagnosis not present

## 2023-05-23 DIAGNOSIS — F419 Anxiety disorder, unspecified: Secondary | ICD-10-CM | POA: Diagnosis not present

## 2023-05-23 DIAGNOSIS — B955 Unspecified streptococcus as the cause of diseases classified elsewhere: Secondary | ICD-10-CM | POA: Diagnosis not present

## 2023-05-23 DIAGNOSIS — J449 Chronic obstructive pulmonary disease, unspecified: Secondary | ICD-10-CM | POA: Insufficient documentation

## 2023-05-23 DIAGNOSIS — I10 Essential (primary) hypertension: Secondary | ICD-10-CM | POA: Diagnosis not present

## 2023-05-23 DIAGNOSIS — J869 Pyothorax without fistula: Secondary | ICD-10-CM | POA: Diagnosis not present

## 2023-05-23 MED ORDER — AMOXICILLIN-POT CLAVULANATE 875-125 MG PO TABS: 1.0000 | ORAL_TABLET | Freq: Two times a day (BID) | ORAL | 0 refills | Status: AC

## 2023-05-23 MED ORDER — IPRATROPIUM-ALBUTEROL 0.5-2.5 (3) MG/3ML IN SOLN: 3.0000 mL | Freq: Four times a day (QID) | RESPIRATORY_TRACT | Status: DC | PRN

## 2023-05-23 MED ORDER — SODIUM CHLORIDE 0.9 % IV SOLN
2.0000 g | INTRAVENOUS | Status: DC
  Administered 2023-05-23: 2 g via INTRAVENOUS
  Filled 2023-05-23: qty 20

## 2023-05-23 NOTE — Plan of Care (Signed)
RT Lung abscess and empyema improving Drain removed Follow up ID recs for ABX  Follow up [facility] as outpatient   Will sign off at this time. No further recommendations at this time.     Lucie Leather, M.D.  [facility]  Medical Director [facility] Medical Director [facility] Cardio-Pulmonary Department

## 2023-05-23 NOTE — Progress Notes (Signed)
[facility]    Date of Admission:  05/16/2023   Total days of antibiotics 7   ID: Sara Cummings is a 52 y.o. female with  strep intermedius right lung abscess/empyema Principal Problem:   Empyema (HCC)    Subjective: No fevers, drainage has decreased in chest tube collection system  Medications:   budesonide (PULMICORT) nebulizer solution  0.5 mg Nebulization BID   buPROPion  150 mg Oral BID   enoxaparin (LOVENOX) injection  40 mg Subcutaneous Q24H   guaiFENesin  1,200 mg Oral BID   methylPREDNISolone (SOLU-MEDROL) injection  20 mg Intravenous BID   pantoprazole  40 mg Oral Daily   rosuvastatin  20 mg Oral Daily   sodium chloride flush  10 mL Intrapleural Q8H   sodium chloride flush  10 mL Intrapleural Q8H   sodium chloride flush  10 mL Intrapleural Q8H    Objective: Vital signs in last 24 hours: Temp:  [97.6 F (36.4 C)-98.2 F (36.8 C)] 97.6 F (36.4 C) (09/11 0857) Pulse Rate:  [70-81] 81 (09/11 0857) Resp:  [18-20] 18 (09/11 0857) BP: (121-129)/(73-84) 122/73 (09/11 0857) SpO2:  [93 %-98 %] 93 % (09/11 0857)  Physical Exam  Constitutional:  oriented to person, place, and time. appears well-developed and well-nourished. No distress.  HENT: [place]/AT, PERRLA, no scleral icterus Mouth/Throat: Oropharynx is clear and moist. No oropharyngeal exudate.  Cardiovascular: Normal rate, regular rhythm and normal heart sounds. Exam reveals no gallop and no friction rub.  No murmur heard.  Pulmonary/Chest: Effort normal and breath sounds normal. No respiratory distress.  has no wheezes. Right sided chest tube with serosanginous fluid Neck = supple, no nuchal rigidity Abdominal: Soft. Bowel sounds are normal.  exhibits no distension. There is no tenderness.  Lymphadenopathy: no cervical adenopathy. No axillary adenopathy Neurological: alert and oriented to person, place, and time.  Skin: Skin is warm and dry. No rash noted. No erythema.  Psychiatric: a  normal mood and affect.  behavior is normal.    Lab Results Recent Labs    05/22/23 0644  WBC 17.4*  HGB 11.6*  HCT 37.5  NA 136  K 3.9  CL 103  CO2 24  BUN 11  CREATININE 0.63    Microbiology: pending Studies/Results: [place] EKG SITE RITE  Result Date: 05/23/2023 If Site Rite image not attached, placement could not be confirmed due to current cardiac rhythm.  DG Chest Port 1 View  Result Date: 05/23/2023 CLINICAL DATA:  Chest tube in place EXAM: PORTABLE CHEST 1 VIEW COMPARISON:  CXR 05/22/23, CT Chest 05/22/23 FINDINGS: Right-sided pleural pigtail drainage catheter in place. No pleural effusion. No pneumothorax. Unchanged bibasilar airspace opacities. Normal cardiac and mediastinal contours. No radiographically apparent displaced rib fractures. Visualized upper abdomen is unremarkable. IMPRESSION: 1. Right-sided pleural pigtail drainage catheter in place. No pneumothorax. 2. Unchanged bibasilar airspace opacities. Electronically Signed   By: Lorenza Cambridge M.D.   On: 05/23/2023 09:25   CT CHEST WO CONTRAST  Result Date: 05/22/2023 CLINICAL DATA:  Pneumonia with complication. EXAM: CT CHEST WITHOUT CONTRAST TECHNIQUE: Multidetector CT imaging of the chest was performed following the standard protocol without IV contrast. RADIATION DOSE REDUCTION: This exam was performed according to the departmental dose-optimization program which includes automated exposure control, adjustment of the mA and/or kV according to patient size and/or use of iterative reconstruction technique. COMPARISON:  May 08, 2023. FINDINGS: Cardiovascular: No evidence of thoracic aortic aneurysm. Normal cardiac size. No pericardial effusion. Mild coronary artery calcifications  are noted. Mediastinum/Nodes: No enlarged mediastinal or axillary lymph nodes. Thyroid gland, trachea, and esophagus demonstrate no significant findings. Lungs/Pleura: Interval placement of percutaneous drainage catheter seen into large abscess or  empyema noted on prior exam posteriorly in right lung base. This appears to be nearly completely decompressed, although air remains within the space. Right lower lobe opacity remains concerning for pneumonia. Mild left posterior basilar atelectasis is noted. Upper Abdomen: No acute abnormality. Musculoskeletal: No chest wall mass or suspicious bone lesions identified. IMPRESSION: Interval placement of percutaneous drainage catheter into large abscess or empyema noted on prior exam posteriorly in right lung base. This appears to be nearly completely decompressed, although some residual air remains within the space. Adjacent right lower lobe airspace opacity is noted concerning for pneumonia. Mild coronary artery calcifications are noted. Electronically Signed   By: Lupita Raider M.D.   On: 05/22/2023 15:28   DG Chest Port 1 View  Result Date: 05/22/2023 CLINICAL DATA:  [id number] Chest tube in place [id number] EXAM: PORTABLE CHEST 1 VIEW COMPARISON:  CXR 05/20/23 FINDINGS: Right sided pleural pigtail drainage catheter appears inferomedially displaced, but still within the pleural space. No pleural effusion. Hazy opacity in the right lung base most likely represent atelectasis. Increased left basilar atelectasis. Unchanged cardiac mediastinal contours. No radiographically apparent displaced rib fracture. Visualized upper abdomen is unremarkable. IMPRESSION: Right sided pleural pigtail drainage catheter appears inferomedially displaced, but still within the pleural space. No pleural effusion. Improved aeration of the right lung base. Increased left basilar atelectasis. Electronically Signed   By: Lorenza Cambridge M.D.   On: 05/22/2023 10:38     Assessment/Plan: Streptococcal intermedius lung abscess/ empyema = recommend 3 more weeks of abtx. Will recommend amox/clav 875mg  po bid to finish out course  Will have her seen at dr Rivka Safer in 2-3 wk -- for follow up to see if needs to extend abtx  [facility] Pager: [phone]  05/23/2023, 2:30 PM

## 2023-05-23 NOTE — Discharge Summary (Signed)
Physician Discharge Summary   Patient: Sara Cummings MRN: [id number] DOB: 30-Nov-1970  Admit date:     05/16/2023  Discharge date: 05/23/23  Discharge Physician: Marcelino Duster   PCP: Sallyanne Kuster, NP   Recommendations at discharge:    PCP follow up in 1 week Finish 3 weeks of Augmentin therapy, follow ID as instructed.  Discharge Diagnoses: Principal Problem:   Empyema (HCC) Active Problems:   Essential hypertension, benign   Hypokalemia   Anxiety   COPD with acute exacerbation (HCC)  Resolved Problems:   * No resolved hospital problems. *  Hospital Course: Montzerrat Skwarek is a 52 y.o. female with medical history significant of HTN, HLD, sent from PCPs office for evaluation of right lung empyema/ lung abscess. Patient was diagnosed with pneumonia July 2nd and sent home with doxycycline. Patient declined admission 2 weeks ago when CT showed right LL abscess. Yesterday, patient went back to see PCP, xray with persistent right lower lobe "collapsed and fluid" and so advised to come to hospital.   During hospital stay patient is seen by pulmonologist who recommended Unasyn therapy, later advised chest tube placement.  Chest tube was placed by IR 05/19/2023, purulent material sent for cultures.  Pulmonologist continue to follow for chest tube management, she did receive alteplase therapy daily for last 3 days.  Her potassium monitored and repleted.  Patient's repeat chest x-ray showed improvement.  Yesterday she had CT chest showed improvement of right lower lobe abscess.  Cultures came back positive for strep intermedius.  ID physician recommended transitioning Unasyn to Augmentin therapy for 3 more weeks and follow-up as outpatient.  Chest tube removed today and she is hemodynamically stable to be discharged home with PCP and ID follow-up upon discharge.  She understands and agrees with the discharge plan.   Assessment and Plan: RLL empyema vs lung abscess- Possible parapneumonic  effusion. S/p left chest tube placement by IR team. Cultures grew abundant gam positive cocci. She got Alteplase therapy through chest tube, with ~44ml/ day output. Repeat CT chest shows Interval placement of percutaneous drainage catheter into large abscess or empyema noted on prior exam posteriorly in right lung base. This appears to be nearly completely decompressed, although some residual air remains within the space. Adjacent right lower lobe airspace opacity is noted concerning for pneumonia.  Chest tube removed today. Chest xray post removal no pneumothorax. Unasyn therapy transitioned to Augmentin for 3 weeks per ID recommendations. She is advised to follow up with ID as instructed.   COPD exacerbation- Started IV solumedrol which is tapered. Duonebs PRN.  WBC trended up from steroids.   Hypokalemia- Improved with oral potassium supplements. Continue oral supplements home dose.   Hypertension- BP improved. Resumed Lisinopril.   Hyperlipidemia- Continue statin.   Anxiety disorder- Continue Klonopin.        Consultants: Pulmonology, ID Procedures performed: CT chest tube placement with DORNASE AND ALTEPLASE THERAPY   Disposition: Home Diet recommendation:  Discharge Diet Orders (From admission, onward)     Start     Ordered   05/23/23 0000  Diet - low sodium heart healthy        05/23/23 1642           Cardiac diet DISCHARGE MEDICATION: Allergies as of 05/23/2023       Reactions   Erythromycin Other (See Comments)   As a child, unsure reaction        Medication List     TAKE these medications    ALPRAZolam 0.25  MG tablet Commonly known as: XANAX Take 1 tablet (0.25 mg total) by mouth daily as needed for anxiety.   amoxicillin-clavulanate 875-125 MG tablet Commonly known as: AUGMENTIN Take 1 tablet by mouth 2 (two) times daily for 21 days.   buPROPion 150 MG 12 hr tablet Commonly known as: ZYBAN Take 1 tablet (150 mg total) by mouth 2  (two) times daily.   cyclobenzaprine 5 MG tablet Commonly known as: FLEXERIL Take 1 tablet (5 mg total) by mouth 3 (three) times daily as needed for muscle spasms.   levonorgestrel 20 MCG/24HR IUD Commonly known as: MIRENA 1 each by Intrauterine route once.   lisinopril 10 MG tablet Commonly known as: ZESTRIL Take 1 tablet (10 mg total) by mouth daily.   potassium chloride SA 20 MEQ tablet Commonly known as: KLOR-CON M Take 1 tablet (20 mEq total) by mouth 2 (two) times daily.   rosuvastatin 20 MG tablet Commonly known as: CRESTOR Take 1 tablet (20 mg total) by mouth daily.   Ventolin HFA 108 (90 Base) MCG/ACT inhaler Generic drug: albuterol Inhale 1 puff into the lungs every 6 (six) hours as needed for wheezing or shortness of breath.        Discharge Exam: Filed Weights   05/16/23 1714  Weight: 83.9 kg   General -  Middle aged Caucasian female, no apparent distress HEENT - PERRLA, EOMI, atraumatic head, non tender sinuses. Lung -clear to auscultation, bibasilar Rales noted. Heart - S1, S2 heard, no murmurs, rubs, no pedal edema Neuro - Alert, awake and oriented x 3, non focal exam. Skin - Warm and dry.  Condition at discharge: stable  The results of significant diagnostics from this hospitalization (including imaging, microbiology, ancillary and laboratory) are listed below for reference.   Imaging Studies: DG Chest Port 1 View  Result Date: 05/23/2023 CLINICAL DATA:  Chest tube removal. EXAM: PORTABLE CHEST 1 VIEW COMPARISON:  Same day. FINDINGS: Right-sided chest tube has been removed. No definite pneumothorax is noted. Able right basilar atelectasis is noted. IMPRESSION: No pneumothorax status post right-sided chest tube removal. Electronically Signed   By: Lupita Raider M.D.   On: 05/23/2023 15:38   [place] EKG SITE RITE  Result Date: 05/23/2023 If Site Rite image not attached, placement could not be confirmed due to current cardiac rhythm.  DG Chest Port 1  View  Result Date: 05/23/2023 CLINICAL DATA:  Chest tube in place EXAM: PORTABLE CHEST 1 VIEW COMPARISON:  CXR 05/22/23, CT Chest 05/22/23 FINDINGS: Right-sided pleural pigtail drainage catheter in place. No pleural effusion. No pneumothorax. Unchanged bibasilar airspace opacities. Normal cardiac and mediastinal contours. No radiographically apparent displaced rib fractures. Visualized upper abdomen is unremarkable. IMPRESSION: 1. Right-sided pleural pigtail drainage catheter in place. No pneumothorax. 2. Unchanged bibasilar airspace opacities. Electronically Signed   By: Lorenza Cambridge M.D.   On: 05/23/2023 09:25   CT CHEST WO CONTRAST  Result Date: 05/22/2023 CLINICAL DATA:  Pneumonia with complication. EXAM: CT CHEST WITHOUT CONTRAST TECHNIQUE: Multidetector CT imaging of the chest was performed following the standard protocol without IV contrast. RADIATION DOSE REDUCTION: This exam was performed according to the departmental dose-optimization program which includes automated exposure control, adjustment of the mA and/or kV according to patient size and/or use of iterative reconstruction technique. COMPARISON:  May 08, 2023. FINDINGS: Cardiovascular: No evidence of thoracic aortic aneurysm. Normal cardiac size. No pericardial effusion. Mild coronary artery calcifications are noted. Mediastinum/Nodes: No enlarged mediastinal or axillary lymph nodes. Thyroid gland, trachea, and esophagus demonstrate  no significant findings. Lungs/Pleura: Interval placement of percutaneous drainage catheter seen into large abscess or empyema noted on prior exam posteriorly in right lung base. This appears to be nearly completely decompressed, although air remains within the space. Right lower lobe opacity remains concerning for pneumonia. Mild left posterior basilar atelectasis is noted. Upper Abdomen: No acute abnormality. Musculoskeletal: No chest wall mass or suspicious bone lesions identified. IMPRESSION: Interval  placement of percutaneous drainage catheter into large abscess or empyema noted on prior exam posteriorly in right lung base. This appears to be nearly completely decompressed, although some residual air remains within the space. Adjacent right lower lobe airspace opacity is noted concerning for pneumonia. Mild coronary artery calcifications are noted. Electronically Signed   By: Lupita Raider M.D.   On: 05/22/2023 15:28   DG Chest Port 1 View  Result Date: 05/22/2023 CLINICAL DATA:  [id number] Chest tube in place [id number] EXAM: PORTABLE CHEST 1 VIEW COMPARISON:  CXR 05/20/23 FINDINGS: Right sided pleural pigtail drainage catheter appears inferomedially displaced, but still within the pleural space. No pleural effusion. Hazy opacity in the right lung base most likely represent atelectasis. Increased left basilar atelectasis. Unchanged cardiac mediastinal contours. No radiographically apparent displaced rib fracture. Visualized upper abdomen is unremarkable. IMPRESSION: Right sided pleural pigtail drainage catheter appears inferomedially displaced, but still within the pleural space. No pleural effusion. Improved aeration of the right lung base. Increased left basilar atelectasis. Electronically Signed   By: Lorenza Cambridge M.D.   On: 05/22/2023 10:38   DG Chest Port 1 View  Result Date: 05/20/2023 CLINICAL DATA:  Chest tube. EXAM: PORTABLE CHEST 1 VIEW COMPARISON:  May 15, 2023. FINDINGS: The heart size and mediastinal contours are within normal limits. Stable position of right-sided chest tube without pneumothorax. Right basilar opacity is slightly decreased in size compared to prior exam suggesting smaller abscess or empyema. Right basilar atelectasis is noted. Left lung is unremarkable. The visualized skeletal structures are unremarkable. IMPRESSION: Stable position of right-sided chest tube. Continued atelectasis right lower lobe. Slightly decreased right lower lobe opacity is noted suggesting improving  empyema or abscess. Electronically Signed   By: Lupita Raider M.D.   On: 05/20/2023 11:33   CT [facility] PLEURAL DRAIN W/INDWELL CATH W/IMG GUIDE  Result Date: 05/19/2023 INDICATION: Empyema EXAM: CT-guided placement of right-sided chest tube TECHNIQUE: Multidetector CT imaging of the chest was performed following the standard protocol without IV contrast. RADIATION DOSE REDUCTION: This exam was performed according to the departmental dose-optimization program which includes automated exposure control, adjustment of the mA and/or kV according to patient size and/or use of iterative reconstruction technique. MEDICATIONS: The patient is currently admitted to the hospital and receiving intravenous antibiotics. The antibiotics were administered within an appropriate time frame prior to the initiation of the procedure. ANESTHESIA/SEDATION: Moderate (conscious) sedation was employed during this procedure. A total of Versed 2 mg and Fentanyl 100 mcg was administered intravenously by the radiology nurse. Total intra-service moderate Sedation Time: 26 minutes. The patient's level of consciousness and vital signs were monitored continuously by radiology nursing throughout the procedure under my direct supervision. COMPLICATIONS: None immediate. PROCEDURE: Informed written consent was obtained from the patient after a thorough discussion of the procedural risks, benefits and alternatives. All questions were addressed. Maximal Sterile Barrier Technique was utilized including caps, mask, sterile gowns, sterile gloves, sterile drape, hand hygiene and skin antiseptic. A timeout was performed prior to the initiation of the procedure. The patient was placed supine on the exam table.  Limited CT of the chest was performed for planning purposes. This demonstrated thick-walled collection in the inferior and posterior right pleural space consistent with empyema. Skin entry site was marked, and the overlying skin was prepped and draped in  the standard sterile fashion. Local analgesia was obtained with 1% lidocaine. Using intermittent CT fluoroscopy, a 19 gauge Yueh catheter was advanced into the identified collections in the right pleural space using a posterolateral approach. Location was confirmed with CT and return of purulent material. Over an Amplatz guidewire, the percutaneous tract was serially dilated to accommodate 10 French locking drainage catheter. Again location was confirmed with CT and return of additional purulent material. A sample of approximately 20 mL was collected and sent to the lab for microbiology analysis. The drainage catheter was secured to the skin using silk suture and a dressing. It was attached to a chest tube drainage system. The patient tolerated the procedure well without immediate complication. IMPRESSION: Successful CT-guided placement of a 10 French locking chest tube into right pleural collection with return of purulent material consistent with empyema. Sample sent for microbiology analysis. Electronically Signed   By: Olive Bass M.D.   On: 05/19/2023 10:37   DG Chest 2 View  Result Date: 05/15/2023 CLINICAL DATA:  Pneumonia and right lower lobe atelectasis. EXAM: CHEST - 2 VIEW COMPARISON:  May 08, 2023. FINDINGS: The heart size and mediastinal contours are within normal limits. Left lung is clear. Stable large opacity is seen in right lung base consistent with abscess or empyema as noted on prior CT scan. The visualized skeletal structures are unremarkable. IMPRESSION: Stable large right lung base opacity is noted concerning for abscess or empyema as noted on prior CT scan. Electronically Signed   By: Lupita Raider M.D.   On: 05/15/2023 16:30   CT Chest W Contrast  Result Date: 05/08/2023 CLINICAL DATA:  Right lower lobe collapse.  Cough. EXAM: CT CHEST WITH CONTRAST TECHNIQUE: Multidetector CT imaging of the chest was performed during intravenous contrast administration. RADIATION DOSE  REDUCTION: This exam was performed according to the departmental dose-optimization program which includes automated exposure control, adjustment of the mA and/or kV according to patient size and/or use of iterative reconstruction technique. CONTRAST:  75mL OMNIPAQUE IOHEXOL 300 MG/ML  SOLN COMPARISON:  Chest x-ray 05/08/2023. Abdomen and pelvis CT 03/13/2023. FINDINGS: Cardiovascular: Heart is nonenlarged. Trace pericardial effusion coronary artery calcifications are seen. Scattered vascular calcifications. Normal caliber aorta. Mediastinum/Nodes: Normal caliber thoracic esophagus. Preserved thyroid gland. No specific abnormal lymph node enlargement in the axillary region or left hilum. There are several small lymph nodes identified in the mediastinum but more numerous than usually seen. There is also some clear enlarged right hilar nodes. Example series 2, image 69 laterally along the right hilum measuring 17 by 13 mm. Lungs/Pleura: Motion. No left-sided consolidation, pneumothorax or effusion. There is fluid collection a presumed enhancing wall new from the prior abdomen and pelvis CT of the right lung base posteriorly measuring 10.3 by 6.8 by 13.4 cm. This could be pleural in etiology with the adjacent parenchymal opacity or infiltrate in the right lower lobe. A component of atelectasis is possible as well. No right-sided pneumothorax no air in this collection. Possibilities include infection with the time course. Empyema would be 1 possibility. Upper Abdomen: Adrenal glands are preserved in the upper abdomen. Fatty liver infiltration. Musculoskeletal: Degenerative changes are seen along the spine IMPRESSION: Complex fluid collection developing in the right lung base measuring up to 13.4 x 10.3  x 6.8 cm with a presumed enhancing capsule. This very well could be pleural. There also parenchymal infiltrative opacity involving the adjacent lung. This was not seen on the CT scan of the abdomen pelvis from earlier in  July 2024. Overall this could be an empyema or other infection. A component of pneumonia is possible as well. Please correlate with clinical presentation. Further workup as clinically appropriate. There are some enlarged mediastinal hilar nodes which could be reactive. Attention on follow-up Coronary artery calcifications. Please correlate for other coronary risk factors. Aortic Atherosclerosis (ICD10-I70.0). Electronically Signed   By: Karen Kays M.D.   On: 05/08/2023 12:08   DG Chest 2 View  Result Date: 05/08/2023 CLINICAL DATA:  Cough and pneumonia for 2 months.  Congestion. EXAM: CHEST - 2 VIEW COMPARISON:  03/13/2023. FINDINGS: Since the prior study, there is new complete collapse of the right lung lower lobe. Findings are likely secondary to mucous plugging. Bronchoscopy is recommended. Bilateral lungs are otherwise clear. Bilateral lateral costophrenic angle are clear. Normal cardio-mediastinal silhouette. No acute osseous abnormalities. The soft tissues are within normal limits. IMPRESSION: *New complete collapse of the right lung lower lobe. Bronchoscopy is recommended. Electronically Signed   By: Jules Schick M.D.   On: 05/08/2023 08:43    Microbiology: Results for orders placed or performed during the hospital encounter of 05/16/23  Expectorated Sputum Assessment w Gram Stain, Rflx to Resp Cult     Status: None   Collection Time: 05/17/23  2:18 PM   Specimen: Sputum  Result Value Ref Range Status   Specimen Description SPUTUM  Final   Special Requests NONE  Final   Sputum evaluation   Final    THIS SPECIMEN IS ACCEPTABLE FOR SPUTUM CULTURE Performed at [facility], [address] [place], [place] [address]    Report Status 05/17/2023 FINAL  Final  Culture, Respiratory w Gram Stain     Status: None   Collection Time: 05/17/23  2:18 PM   Specimen: SPU  Result Value Ref Range Status   Specimen Description   Final    SPUTUM Performed at [facility], [address], [place], [place] [address]    Special Requests   Final    NONE Reflexed from [phone] Performed at [facility], [address] [place], [place] [address]    Gram Stain   Final    MODERATE WBC PRESENT,BOTH PMN AND MONONUCLEAR MODERATE GRAM POSITIVE COCCI FEW GRAM NEGATIVE RODS FEW GRAM POSITIVE RODS    Culture   Final    Normal respiratory flora-no Staph aureus or Pseudomonas seen Performed at [facility], [address] [place], [place] [address]    Report Status 05/20/2023 FINAL  Final  MRSA Next Gen by PCR, Nasal     Status: None   Collection Time: 05/17/23 10:30 PM   Specimen: Nasal Mucosa; Nasal Swab  Result Value Ref Range Status   MRSA by PCR Next Gen NOT DETECTED NOT DETECTED Final    Comment: (NOTE) The GeneXpert MRSA Assay (FDA approved for NASAL specimens only), is one component of a comprehensive MRSA colonization surveillance program. It is not intended to diagnose MRSA infection nor to guide or monitor treatment for MRSA infections. Test performance is not FDA approved in patients less than 19 years old. Performed at [facility], [address] [place], [place] [address]   Aerobic/Anaerobic Culture w Gram Stain (surgical/deep wound)     Status: None (Preliminary result)  Collection Time: 05/19/23 10:31 AM   Specimen: Empyema; Abscess  Result Value Ref Range Status   Specimen Description   Final    EMPYEMA Performed at [facility], [address] [place], [place] [address]    Special Requests   Final    NONE Performed at [facility], [address] [place], [place] [address]    Gram Stain   Final    ABUNDANT WBC PRESENT, PREDOMINANTLY PMN ABUNDANT GRAM POSITIVE COCCI IN CHAINS Performed at [facility], [address], [place], [place] [address]    Culture   Final    ABUNDANT STREPTOCOCCUS INTERMEDIUS Sent to [facility] for further susceptibility testing. NO ANAEROBES ISOLATED; CULTURE  IN PROGRESS FOR 5 DAYS    Report Status PENDING  Incomplete  Susceptibility, Aer + Anaerob     Status: Abnormal   Collection Time: 05/19/23 10:31 AM  Result Value Ref Range Status   Suscept, Aer + Anaerob Preliminary report (A)  Final    Comment: (NOTE) Performed At: [facility] [address] [place], [place] [id number] Jolene Schimke MD GL:[id number]    Source of Sample   Final    [phone] STREP INTERMIDIS SUSCEPT RIGHT LOWER LOBE LUNG    Comment: Performed at [facility], [address], [place], [place] [address]  Susceptibility Result     Status: Abnormal   Collection Time: 05/19/23 10:31 AM  Result Value Ref Range Status   Suscept Result 1 Comment (A)  Final    Comment: (NOTE) Streptococcus intermedius Identification performed by account, not confirmed by this laboratory. Performed At: [facility] [address] [place], [place] [id number] Jolene Schimke MD TD:[id number]     Labs: CBC: Recent Labs  Lab 05/16/23 1825 05/17/23 0846 05/18/23 0329 05/19/23 0647 05/20/23 0550 05/22/23 0644  WBC 16.5* 15.8* 12.1* 13.4* 10.2 17.4*  NEUTROABS 12.5*  --   --   --   --   --   HGB 11.0* 10.6* 9.9* 9.8* 10.0* 11.6*  HCT 34.1* 31.5* 31.1* 30.6* 31.5* 37.5  MCV 92.4 90.3 93.1 93.6 93.2 94.9  PLT 682* 665* 610* 583* 559* 647*   Basic Metabolic Panel: Recent Labs  Lab 05/16/23 1825 05/17/23 0846 05/18/23 0329 05/22/23 0644  NA 135 135 137 136  K 2.8* 3.2* 4.1 3.9  CL 99 102 105 103  CO2 25 22 24 24   GLUCOSE 118* 106* 108* 152*  BUN 9 5* 6 11  CREATININE 0.57 0.51 0.52 0.63  CALCIUM 8.7* 8.4* 8.2* 8.8*  MG  --   --   --  2.1  PHOS  --   --   --  3.5   Liver Function Tests: Recent Labs  Lab 05/16/23 1825  AST 43*  ALT 34  ALKPHOS 235*  BILITOT 0.3  PROT 7.6  ALBUMIN 2.2*   CBG: Recent Labs  Lab 05/21/23 2336  GLUCAP 149*    Discharge time spent: 38 minutes.  Signed: Marcelino Duster, MD [facility] 05/23/2023

## 2023-05-23 NOTE — Progress Notes (Signed)
Chest tube removal requested by Dr Belia Heman and approved by Dr Juliette Alcide. Patient was examined and chest tube removal was explained to patient. Patient in agreement with plan for removal. Suction was turned off prior to removal. Suture was cut and removed at drain insertion site. Chest tube was then clamped and tube was cut distal to clamping site to release internal locking suture. Gentle traction was then applied and chest tube was successfully removed and site was covered with occlusive dressing. Post-removal CXR ordered and pending. Patient tolerated removal well without immediate complication. Kennieth Francois, PA-C 05/23/2023 2:02 PM

## 2023-05-25 LAB — SUSCEPTIBILITY, AER + ANAEROB: Source of Sample: 86680

## 2023-05-25 LAB — SUSCEPTIBILITY RESULT

## 2023-06-01 LAB — AEROBIC/ANAEROBIC CULTURE W GRAM STAIN (SURGICAL/DEEP WOUND)

## 2023-06-12 ENCOUNTER — Ambulatory Visit: Payer: BLUE CROSS/BLUE SHIELD | Attending: Infectious Diseases | Admitting: Infectious Diseases

## 2023-06-12 ENCOUNTER — Encounter: Payer: Self-pay | Admitting: Infectious Diseases

## 2023-06-12 VITALS — BP 118/82 | HR 90 | Temp 97.9°F | Ht 70.0 in | Wt 175.0 lb

## 2023-06-12 DIAGNOSIS — E785 Hyperlipidemia, unspecified: Secondary | ICD-10-CM | POA: Insufficient documentation

## 2023-06-12 DIAGNOSIS — J869 Pyothorax without fistula: Secondary | ICD-10-CM | POA: Diagnosis present

## 2023-06-12 DIAGNOSIS — F17210 Nicotine dependence, cigarettes, uncomplicated: Secondary | ICD-10-CM | POA: Insufficient documentation

## 2023-06-12 DIAGNOSIS — I10 Essential (primary) hypertension: Secondary | ICD-10-CM | POA: Insufficient documentation

## 2023-06-12 NOTE — Patient Instructions (Signed)
You are here for empyema rt lung and on amox/clav- you will finish 4 weeks soon- will get labs-ppulmonary may check cxr tomorrow,

## 2023-06-13 ENCOUNTER — Ambulatory Visit: Payer: BLUE CROSS/BLUE SHIELD | Admitting: Pulmonary Disease

## 2023-06-13 ENCOUNTER — Encounter: Payer: Self-pay | Admitting: Pulmonary Disease

## 2023-06-13 ENCOUNTER — Other Ambulatory Visit
Admission: RE | Admit: 2023-06-13 | Discharge: 2023-06-13 | Disposition: A | Discharge status: Home / Self Care | Payer: BLUE CROSS/BLUE SHIELD | Source: Ambulatory Visit | Attending: Infectious Diseases | Admitting: Infectious Diseases

## 2023-06-13 VITALS — BP 126/76 | HR 72 | Temp 97.6°F | Ht 70.0 in | Wt 176.6 lb

## 2023-06-13 DIAGNOSIS — J869 Pyothorax without fistula: Secondary | ICD-10-CM

## 2023-06-13 LAB — COMPREHENSIVE METABOLIC PANEL
ALT: 16 U/L (ref 0–44)
AST: 23 U/L (ref 15–41)
Albumin: 3.5 g/dL (ref 3.5–5.0)
Alkaline Phosphatase: 97 U/L (ref 38–126)
Anion gap: 8 (ref 5–15)
BUN: 9 mg/dL (ref 6–20)
CO2: 24 mmol/L (ref 22–32)
Calcium: 9.1 mg/dL (ref 8.9–10.3)
Chloride: 101 mmol/L (ref 98–111)
Creatinine, Ser: 0.63 mg/dL (ref 0.44–1.00)
GFR, Estimated: >60 mL/min (ref >60)
Glucose, Bld: 113 mg/dL — ABNORMAL HIGH (ref 70–99)
Potassium: 3.6 mmol/L (ref 3.5–5.1)
Sodium: 133 mmol/L — ABNORMAL LOW (ref 135–145)
Total Bilirubin: 0.3 mg/dL (ref 0.3–1.2)
Total Protein: 7.5 g/dL (ref 6.5–8.1)

## 2023-06-13 LAB — CBC WITH DIFFERENTIAL/PLATELET
Abs Immature Granulocytes: 0.03 10*3/uL (ref 0.00–0.07)
Basophils Absolute: 0.1 10*3/uL (ref 0.0–0.1)
Basophils Relative: 1 %
Eosinophils Absolute: 0.9 10*3/uL — ABNORMAL HIGH (ref 0.0–0.5)
Eosinophils Relative: 7 %
HCT: 41 % (ref 36.0–46.0)
Hemoglobin: 13 g/dL (ref 12.0–15.0)
Immature Granulocytes: 0 %
Lymphocytes Relative: 50 %
Lymphs Abs: 6.2 10*3/uL — ABNORMAL HIGH (ref 0.7–4.0)
MCH: 30.1 pg (ref 26.0–34.0)
MCHC: 31.7 g/dL (ref 30.0–36.0)
MCV: 94.9 fL (ref 80.0–100.0)
Monocytes Absolute: 0.6 10*3/uL (ref 0.1–1.0)
Monocytes Relative: 5 %
Neutro Abs: 4.6 10*3/uL (ref 1.7–7.7)
Neutrophils Relative %: 37 %
Platelets: 373 10*3/uL (ref 150–400)
RBC: 4.32 MIL/uL (ref 3.87–5.11)
RDW: 17 % — ABNORMAL HIGH (ref 11.5–15.5)
WBC: 12.5 10*3/uL — ABNORMAL HIGH (ref 4.0–10.5)
nRBC: 0 % (ref 0.0–0.2)

## 2023-06-13 LAB — C-REACTIVE PROTEIN: CRP: 1 mg/dL — ABNORMAL HIGH (ref <1.0)

## 2023-06-13 NOTE — Progress Notes (Signed)
Synopsis: Referred in by Sallyanne Kuster, NP   Subjective:   PATIENT ID: Sara Cummings GENDER: female DOB: 1971-07-16, MRN: [id number]  Chief Complaint  Patient presents with   Follow-up    No cough, no shortness of breath or wheezing.    HPI Sara Cummings is a 52 year old female patient with a past medical history of heavy tobacco use, GERD, hyperlipidemia and hypertension who is presenting today to the pulmonary clinic for an inpatient follow-up visit for empyema.  She presented to [facility] on 09/04 with cough and sputum production.  Her labs were significant for elevated WBC count and inflammatory markers including CRP and ESR.  She underwent a CT chest with contrast that revealed right lower lobe fluid collection measuring up to 13.4 cm which was concerning for an empyema versus lung abscess.  She subsequently underwent chest tube placement on 09/07 with purulent drainage.  Culture grew strep intermedius.  Chest tube was eventually removed on 09/11.  Repeat CT chest on 09/10 showed near complete decompression of the empyema.  She was discharged home on Augmentin for a total of 4 weeks.  Today, she feels well overall has no major symptoms.  She does report 13 pounds weight loss but thinks it is secondary to Wellbutrin.  She does also report diminished appetite.  Family history -father with lung cancer passed away at the age of 54  Social history -active smoker smokes 1 pack/day for 30 years, drinks couple beers per day and has no illicit drug use.   ROS All systems were reviewed and are negative except for the above. Objective:   Vitals:   06/13/23 1330  BP: 126/76  Pulse: 72  Temp: 97.6 F (36.4 C)  TempSrc: Temporal  SpO2: 96%  Weight: 176 lb 9.6 oz (80.1 kg)  Height: 5\' 10"  (1.778 m)   96% on RA BMI Readings from Last 3 Encounters:  06/13/23 25.34 kg/m  06/12/23 25.11 kg/m  05/16/23 26.54 kg/m   Wt Readings from Last 3 Encounters:  06/13/23 176  lb 9.6 oz (80.1 kg)  06/12/23 175 lb (79.4 kg)  05/16/23 185 lb (83.9 kg)    Physical Exam GEN: NAD, Healthy Appearing HEENT: Supple Neck, Reactive Pupils, EOMI  CVS: Normal S1, Normal S2, RRR, No murmurs or ES appreciated  Lungs: Diminished air entry at the base.  Abdomen: Soft, non tender, non distended, + BS  Extremities: Warm and well perfused, No edema  Skin: No suspicious lesions appreciated  Psych: Normal Affect  Ancillary Information   CBC    Component Value Date/Time   WBC 12.5 (H) 06/13/2023 1406   RBC 4.32 06/13/2023 1406   HGB 13.0 06/13/2023 1406   HGB 14.5 11/20/2022 0942   HCT 41.0 06/13/2023 1406   HCT 41.6 11/20/2022 0942   PLT 373 06/13/2023 1406   PLT 279 01/06/2015 1505   MCV 94.9 06/13/2023 1406   MCV 105 (H) 11/20/2022 0942   MCV 91 01/06/2015 1505   MCH 30.1 06/13/2023 1406   MCHC 31.7 06/13/2023 1406   RDW 17.0 (H) 06/13/2023 1406   RDW 13.5 11/20/2022 0942   RDW 13.8 01/06/2015 1505   LYMPHSABS PENDING 06/13/2023 1406   LYMPHSABS 3.6 (H) 11/20/2022 0942   LYMPHSABS 4.2 (H) 01/06/2015 1505   MONOABS PENDING 06/13/2023 1406   MONOABS 0.6 01/06/2015 1505   EOSABS PENDING 06/13/2023 1406   EOSABS 0.3 11/20/2022 0942   EOSABS 0.3 01/06/2015 1505   BASOSABS PENDING 06/13/2023 1406   BASOSABS 0.1  11/20/2022 0942   BASOSABS 0.1 01/06/2015 1505    Imaging  CT chest 09/10  Interval placement of percutaneous drainage catheter seen into large abscess or empyema noted on prior exam posteriorly in right lung base. This appears to be nearly completely decompressed, although air remains within the space. Right lower lobe opacity remains concerning for pneumonia. Mild left posterior basilar atelectasis is noted.  CT chest 08/27 Motion. No left-sided consolidation, pneumothorax or effusion. There is fluid collection a presumed enhancing wall new from the prior abdomen and pelvis CT of the right lung base posteriorly measuring 10.3 by 6.8 by 13.4  cm. This could be pleural in etiology with the adjacent parenchymal opacity or infiltrate in the right lower lobe. A component of atelectasis is possible as well. No right-sided pneumothorax no air in this collection. Possibilities include infection with the time course. Empyema would be 1 possibility.      No data to display           Assessment & Plan:  Sara Cummings is a 52 year old female patient with a past medical history of heavy tobacco use, GERD, hyperlipidemia and hypertension who is presenting today to the pulmonary clinic for an inpatient follow-up visit for empyema.  #Strep Intermedius empyema s/p Chest tube placement 05/19/2023 s/p removal 05/23/2023.  []  Repeat CT chest with contrast in 4 weeks to ensure complete resolution and evaluate airways for endobronchial lesions/foreign body.  []  Currently on Augmentin last dose tomorrow (Total of 4 weeks). Managed by ID.   #Heavy tobacco use  Active smoker. 30 PPY. Family history of lung cancer.  []  Enroll in LDCT on next visit.  []  PFTs to evaluate for COPD  []  Advised on smoking cessation and relayed risk associations with lung cancer.   Return in about 3 months (around 09/13/2023).  I spent 60 minutes caring for this patient today, including preparing to see the patient, obtaining a medical history , reviewing a separately obtained history, performing a medically appropriate examination and/or evaluation, counseling and educating the patient/family/caregiver, ordering medications, tests, or procedures, documenting clinical information in the electronic health record, and independently interpreting results (not separately reported/billed) and communicating results to the patient/family/caregiver  Janann Colonel, MD [facility] 06/13/2023 2:18 PM

## 2023-06-13 NOTE — Progress Notes (Signed)
NAME: Sara Cummings  DOB: Sep 25, 1970  MRN: [id number]  Date/Time: 06/12/2023 1145  Subjective:  Follow up visit for rt empyema- wa sin [facility] between 05/16/23-05/23/23 for rt empyema ? Sara Cummings is a 52 y.o. with a history of HTN, HLD, was in [facility] for rt lung consolidation and empyema. She had rt pleural drain placement , alteplase instillation and ube removed on 9/10. She was initially on IV unasyn and the culture was strep intermedius. She was discharged home on 3 more weeks of augmentin to complete 4 weeks The CXR on 9/11 was much improved Pt is doing much better No cough  No sob Follow up with pulmonary on 06/13/23   Past Medical History:  Diagnosis Date   Anxiety    Hypercholesterolemia    Hypertension     Past Surgical History:  Procedure Laterality Date   BREAST BIOPSY Left 10/09/2018   pending path   NO PAST SURGERIES      Social History   Socioeconomic History   Marital status: Single    Spouse name: Not on file   Number of children: Not on file   Years of education: Not on file   Highest education level: Not on file  Occupational History   Not on file  Tobacco Use   Smoking status: Every Day    Current packs/day: 1.00    Types: Cigarettes   Smokeless tobacco: Never  Vaping Use   Vaping status: Never Used  Substance and Sexual Activity   Alcohol use: Yes    Comment: occ.   Drug use: No   Sexual activity: Not Currently    Birth control/protection: I.U.D.  Other Topics Concern   Not on file  Social History Narrative   Not on file   Social Determinants of Health   Financial Resource Strain: Not on file  Food Insecurity: No Food Insecurity (05/16/2023)   Hunger Vital Sign    Worried About Running Out of Food in the Last Year: Never true    Ran Out of Food in the Last Year: Never true  Transportation Needs: No Transportation Needs (05/16/2023)   PRAPARE - Administrator, Civil Service (Medical): No    Lack of Transportation  (Non-Medical): No  Physical Activity: Inactive (09/21/2017)   Exercise Vital Sign    Days of Exercise per Week: 0 days    Minutes of Exercise per Session: 0 min  Stress: No Stress Concern Present (09/21/2017)   [employer] of Occupational Health - Occupational Stress Questionnaire    Feeling of Stress : Only a little  Social Connections: Moderately Isolated (09/21/2017)   Social Connection and Isolation Panel [NHANES]    Frequency of Communication with Friends and Family: Three times a week    Frequency of Social Gatherings with Friends and Family: More than three times a week    Attends Religious Services: Never    Database administrator or Organizations: No    Attends Banker Meetings: Never    Marital Status: Never married  Intimate Partner Violence: Not At Risk (05/16/2023)   Humiliation, Afraid, Rape, and Kick questionnaire    Fear of Current or Ex-Partner: No    Emotionally Abused: No    Physically Abused: No    Sexually Abused: No    Family History  Problem Relation Age of Onset   Lung cancer Father 50   Hypertension Mother    Heart disease Maternal Grandmother    Allergies  Allergen Reactions  Erythromycin Other (See Comments)    As a child, unsure reaction    I? Current Outpatient Medications  Medication Sig Dispense Refill   amoxicillin-clavulanate (AUGMENTIN) 875-125 MG tablet Take 1 tablet by mouth 2 (two) times daily for 21 days. 42 tablet 0   buPROPion (ZYBAN) 150 MG 12 hr tablet Take 1 tablet (150 mg total) by mouth 2 (two) times daily. 60 tablet 3   cyclobenzaprine (FLEXERIL) 5 MG tablet Take 1 tablet (5 mg total) by mouth 3 (three) times daily as needed for muscle spasms. 30 tablet 1   levonorgestrel (MIRENA) 20 MCG/24HR IUD 1 each by Intrauterine route once.     lisinopril (ZESTRIL) 10 MG tablet Take 1 tablet (10 mg total) by mouth daily. 90 tablet 0   potassium chloride SA (KLOR-CON M) 20 MEQ tablet Take 1 tablet (20 mEq total) by mouth 2  (two) times daily. 10 tablet 0   rosuvastatin (CRESTOR) 20 MG tablet Take 1 tablet (20 mg total) by mouth daily. 90 tablet 1   VENTOLIN HFA 108 (90 Base) MCG/ACT inhaler Inhale 1 puff into the lungs every 6 (six) hours as needed for wheezing or shortness of breath. 18 each 5   ALPRAZolam (XANAX) 0.25 MG tablet Take 1 tablet (0.25 mg total) by mouth daily as needed for anxiety. (Patient not taking: Reported on 06/12/2023) 60 tablet 2   No current facility-administered medications for this visit.     Abtx:  Anti-infectives (From admission, onward)    None       REVIEW OF SYSTEMS:  Const: negative fever, negative chills, negative weight loss Eyes: negative diplopia or visual changes, negative eye pain ENT: negative coryza, negative sore throat Resp: negative cough, hemoptysis, dyspnea Cards: negative for chest pain, palpitations, lower extremity edema GU: negative for frequency, dysuria and hematuria GI: Negative for abdominal pain, diarrhea, bleeding, constipation Skin: negative for rash and pruritus Heme: negative for easy bruising and gum/nose bleeding MS: negative for myalgias, arthralgias, back pain and muscle weakness Neurolo:negative for headaches, dizziness, vertigo, memory problems  Psych: negative for feelings of anxiety, depression  Endocrine: negative for thyroid, diabetes Allergy/Immunology- negative for any medication or food allergies ?  Objective:  VITALS:  BP 118/82   Pulse 90   Temp 97.9 F (36.6 C) (Temporal)   Ht 5\' 10"  (1.778 m)   Wt 175 lb (79.4 kg)   BMI 25.11 kg/m   PHYSICAL EXAM:  General: Alert, cooperative, no distress, appears stated age.  Head: Normocephalic, without obvious abnormality, atraumatic. Eyes: Conjunctivae clear, anicteric sclerae. Pupils are equal ENT Nares normal. No drainage or sinus tenderness. Lips, mucosa, and tongue normal. No Thrush Neck: Supple, symmetrical, no adenopathy, thyroid: non tender no carotid bruit and no  JVD. Back: No CVA tenderness. Lungs: Clear to auscultation bilaterally. Few crepts rt base Heart: Regular rate and rhythm, no murmur, rub or gallop. Abdomen: Soft, non-tender,not distended. Bowel sounds normal. No masses Extremities: atraumatic, no cyanosis. No edema. No clubbing Skin: No rashes or lesions. Or bruising Lymph: Cervical, supraclavicular normal. Neurologic: Grossly non-focal Pertinent Labs none   Impression/Recommendation ?Rt lung empyema- s/p drain, IV antibiotic and now Po augmentin- doing very well Will complete 4 weeks tomorrow- she doe snot need further antibiotics Will do labs She has a follow up with pulmonary  tomorrow Can do CBC /CMP Discussed about dental hygiene and she has stopped alcohol ?Follow PRN ? ___________________________________________________ Discussed with patient,Note:  This document was prepared using Dragon voice recognition software and may include  unintentional dictation errors.

## 2023-06-14 ENCOUNTER — Telehealth: Payer: Self-pay

## 2023-06-14 NOTE — Telephone Encounter (Signed)
-----   Message from Lynn Ito sent at 06/14/2023  1:18 PM EDT ----- Can you let her know that the labs good so much better and the infection/inflammation is resolving, thx ----- Message ----- From: Interface, Lab In [place] Sent: 06/13/2023   2:16 PM EDT To: Lynn Ito, MD

## 2023-06-14 NOTE — Telephone Encounter (Signed)
I attempted to reach patient to relay results no answer and I LVM for her to call back. Kristina Bertone Jonathon Resides, CMA

## 2023-06-15 NOTE — Telephone Encounter (Signed)
Second attempt to reach patient, no answer. Message left with previous attempt.   Kai Calico D Jannelle Notaro, RN  

## 2023-06-18 NOTE — Telephone Encounter (Signed)
Third attempt to reach patient, no answer. Left HIPAA compliant voicemail requesting callback.   Orah Sonnen D Teryl Gubler, RN  

## 2023-06-22 ENCOUNTER — Encounter: Payer: Self-pay | Admitting: Nurse Practitioner

## 2023-07-04 ENCOUNTER — Encounter: Payer: Self-pay | Admitting: Nurse Practitioner

## 2023-07-04 ENCOUNTER — Telehealth (INDEPENDENT_AMBULATORY_CARE_PROVIDER_SITE_OTHER): Payer: BLUE CROSS/BLUE SHIELD | Admitting: Nurse Practitioner

## 2023-07-04 VITALS — Resp 16 | Ht 70.0 in | Wt 170.0 lb

## 2023-07-04 DIAGNOSIS — J069 Acute upper respiratory infection, unspecified: Secondary | ICD-10-CM | POA: Diagnosis not present

## 2023-07-04 DIAGNOSIS — U071 COVID-19: Secondary | ICD-10-CM

## 2023-07-04 MED ORDER — BENZONATATE 200 MG PO CAPS
200.0000 mg | ORAL_CAPSULE | Freq: Three times a day (TID) | ORAL | 0 refills | Status: DC | PRN
Start: 2023-07-04 — End: 2023-11-15

## 2023-07-04 MED ORDER — NIRMATRELVIR/RITONAVIR (PAXLOVID)TABLET
3.0000 | ORAL_TABLET | Freq: Two times a day (BID) | ORAL | 0 refills | Status: AC
Start: 2023-07-04 — End: 2023-07-09

## 2023-07-04 NOTE — Progress Notes (Signed)
[facility] [address] [place], [place] [address]  Internal MEDICINE  Telephone Visit  Patient Name: Sara Cummings  [id number]  [id number]  Date of Service: 07/04/2023  I connected with the patient at 1700 by telephone and verified the patients identity using two identifiers.   I discussed the limitations, risks, security and privacy concerns of performing an evaluation and management service by telephone and the availability of in person appointments. I also discussed with the patient that there may be a patient responsible charge related to the service.  The patient expressed understanding and agrees to proceed.    Chief Complaint  Patient presents with   Telephone Screen    Covid positive.    Telephone Assessment   Headache    HPI Sara Cummings presents for a telehealth virtual visit for covid positive URI Covid test was positive today, symptoms started in the past 48 hours.  Reports cough, headache, nasal congestion, fatigue Ok with paxlovid   Current Medication: Outpatient Encounter Medications as of 07/04/2023  Medication Sig   benzonatate (TESSALON) 200 MG capsule Take 1 capsule (200 mg total) by mouth 3 (three) times daily as needed for cough.   [EXPIRED] nirmatrelvir/ritonavir (PAXLOVID) 20 x 150 MG & 10 x 100MG  TABS Take 3 tablets by mouth 2 (two) times daily for 5 days.   ALPRAZolam (XANAX) 0.25 MG tablet Take 1 tablet (0.25 mg total) by mouth daily as needed for anxiety.   buPROPion (ZYBAN) 150 MG 12 hr tablet Take 1 tablet (150 mg total) by mouth 2 (two) times daily.   cyclobenzaprine (FLEXERIL) 5 MG tablet Take 1 tablet (5 mg total) by mouth 3 (three) times daily as needed for muscle spasms.   levonorgestrel (MIRENA) 20 MCG/24HR IUD 1 each by Intrauterine route once.   lisinopril (ZESTRIL) 10 MG tablet Take 1 tablet (10 mg total) by mouth daily.   potassium chloride SA (KLOR-CON M) 20 MEQ tablet Take 1 tablet (20 mEq total) by mouth 2 (two) times daily.    rosuvastatin (CRESTOR) 20 MG tablet Take 1 tablet (20 mg total) by mouth daily.   VENTOLIN HFA 108 (90 Base) MCG/ACT inhaler Inhale 1 puff into the lungs every 6 (six) hours as needed for wheezing or shortness of breath.   No facility-administered encounter medications on file as of 07/04/2023.    Surgical History: Past Surgical History:  Procedure Laterality Date   BREAST BIOPSY Left 10/09/2018   pending path   NO PAST SURGERIES      Medical History: Past Medical History:  Diagnosis Date   Anxiety    Hypercholesterolemia    Hypertension     Family History: Family History  Problem Relation Age of Onset   Lung cancer Father 36   Hypertension Mother    Heart disease Maternal Grandmother     Social History   Socioeconomic History   Marital status: Single    Spouse name: Not on file   Number of children: Not on file   Years of education: Not on file   Highest education level: Not on file  Occupational History   Not on file  Tobacco Use   Smoking status: Every Day    Current packs/day: 1.00    Types: Cigarettes   Smokeless tobacco: Never  Vaping Use   Vaping status: Never Used  Substance and Sexual Activity   Alcohol use: Yes    Comment: occ.   Drug use: No   Sexual activity: Not Currently    Birth  control/protection: I.U.D.  Other Topics Concern   Not on file  Social History Narrative   Not on file   Social Determinants of Health   Financial Resource Strain: Not on file  Food Insecurity: No Food Insecurity (05/16/2023)   Hunger Vital Sign    Worried About Running Out of Food in the Last Year: Never true    Ran Out of Food in the Last Year: Never true  Transportation Needs: No Transportation Needs (05/16/2023)   PRAPARE - Administrator, Civil Service (Medical): No    Lack of Transportation (Non-Medical): No  Physical Activity: Inactive (09/21/2017)   Exercise Vital Sign    Days of Exercise per Week: 0 days    Minutes of Exercise per  Session: 0 min  Stress: No Stress Concern Present (09/21/2017)   [employer] of Occupational Health - Occupational Stress Questionnaire    Feeling of Stress : Only a little  Social Connections: Moderately Isolated (09/21/2017)   Social Connection and Isolation Panel [NHANES]    Frequency of Communication with Friends and Family: Three times a week    Frequency of Social Gatherings with Friends and Family: More than three times a week    Attends Religious Services: Never    Cummings administrator or Organizations: No    Attends Banker Meetings: Never    Marital Status: Never married  Intimate Partner Violence: Not At Risk (05/16/2023)   Humiliation, Afraid, Rape, and Kick questionnaire    Fear of Current or Ex-Partner: No    Emotionally Abused: No    Physically Abused: No    Sexually Abused: No      Review of Systems  Constitutional:  Positive for fatigue.  HENT:  Positive for congestion and sinus pressure.   Respiratory:  Positive for cough. Negative for chest tightness, shortness of breath and wheezing.   Cardiovascular:  Negative for chest pain and palpitations.  Neurological:  Positive for headaches.    Vital Signs: Resp 16   Ht 5\' 10"  (1.778 m)   Wt 170 lb (77.1 kg)   BMI 24.39 kg/m    Observation/Objective: She is alert and oriented. No acute distress noted.     Assessment/Plan: 1. Upper respiratory tract infection due to COVID-19 virus Paxlovid and cough medication ordered, take as prescribed.  - nirmatrelvir/ritonavir (PAXLOVID) 20 x 150 MG & 10 x 100MG  TABS; Take 3 tablets by mouth 2 (two) times daily for 5 days.  Dispense: 30 tablet; Refill: 0 - benzonatate (TESSALON) 200 MG capsule; Take 1 capsule (200 mg total) by mouth 3 (three) times daily as needed for cough.  Dispense: 30 capsule; Refill: 0   General Counseling: Sara Cummings understanding of the findings of today's phone visit and agrees with plan of treatment. I have discussed any  further diagnostic evaluation that may be needed or ordered today. We also reviewed her medications today. she has been encouraged to call the office with any questions or concerns that should arise related to todays visit.  Return if symptoms worsen or fail to improve.   No orders of the defined types were placed in this encounter.   Meds ordered this encounter  Medications   nirmatrelvir/ritonavir (PAXLOVID) 20 x 150 MG & 10 x 100MG  TABS    Sig: Take 3 tablets by mouth 2 (two) times daily for 5 days.    Dispense:  30 tablet    Refill:  0    Normal GFR   benzonatate (TESSALON) 200  MG capsule    Sig: Take 1 capsule (200 mg total) by mouth 3 (three) times daily as needed for cough.    Dispense:  30 capsule    Refill:  0    Fill new script today    Time spent:10 Minutes Time spent with patient included reviewing progress notes, labs, imaging studies, and discussing plan for follow up.  Sara Cummings was reviewed by me for overdose risk score (ORS) if appropriate.  This patient was seen by Sara Kuster, FNP-C in collaboration with Dr. Beverely Risen as a part of collaborative care agreement.  Sara Cummings R. Tedd Sias, MSN, FNP-C Internal medicine

## 2023-07-05 ENCOUNTER — Telehealth: Payer: Self-pay | Admitting: Nurse Practitioner

## 2023-07-05 NOTE — Telephone Encounter (Signed)
Emailed patient work note. Scanned-Toni

## 2023-07-12 ENCOUNTER — Ambulatory Visit: Payer: BLUE CROSS/BLUE SHIELD

## 2023-07-31 ENCOUNTER — Other Ambulatory Visit: Payer: Self-pay | Admitting: Internal Medicine

## 2023-07-31 DIAGNOSIS — Z1211 Encounter for screening for malignant neoplasm of colon: Secondary | ICD-10-CM

## 2023-07-31 LAB — COLOGUARD

## 2023-08-03 ENCOUNTER — Encounter: Payer: Self-pay | Admitting: Nurse Practitioner

## 2023-08-13 ENCOUNTER — Other Ambulatory Visit: Payer: Self-pay | Admitting: Nurse Practitioner

## 2023-08-13 DIAGNOSIS — Z79899 Other long term (current) drug therapy: Secondary | ICD-10-CM

## 2023-08-20 ENCOUNTER — Telehealth: Payer: Self-pay | Admitting: Internal Medicine

## 2023-08-20 NOTE — Telephone Encounter (Signed)
Lvm to schedule new pulm appointment per [place] [facility]'s request-Toni

## 2023-08-23 ENCOUNTER — Other Ambulatory Visit: Payer: Self-pay | Admitting: Nurse Practitioner

## 2023-08-23 DIAGNOSIS — Z79899 Other long term (current) drug therapy: Secondary | ICD-10-CM

## 2023-08-25 ENCOUNTER — Other Ambulatory Visit: Payer: Self-pay | Admitting: Internal Medicine

## 2023-08-25 DIAGNOSIS — Z1231 Encounter for screening mammogram for malignant neoplasm of breast: Secondary | ICD-10-CM

## 2023-08-25 DIAGNOSIS — J869 Pyothorax without fistula: Secondary | ICD-10-CM

## 2023-08-28 ENCOUNTER — Telehealth: Payer: Self-pay | Admitting: Internal Medicine

## 2023-08-28 NOTE — Telephone Encounter (Signed)
Left another vm  to schedule new pulm appointment per [place] [facility]'s request-Toni

## 2023-09-18 ENCOUNTER — Ambulatory Visit: Payer: BLUE CROSS/BLUE SHIELD | Admitting: Internal Medicine

## 2023-09-28 LAB — COLOGUARD

## 2023-10-08 LAB — COLOGUARD

## 2023-10-23 ENCOUNTER — Telehealth (INDEPENDENT_AMBULATORY_CARE_PROVIDER_SITE_OTHER): Payer: BLUE CROSS/BLUE SHIELD | Admitting: Nurse Practitioner

## 2023-10-23 ENCOUNTER — Encounter: Payer: Self-pay | Admitting: Nurse Practitioner

## 2023-10-23 ENCOUNTER — Telehealth: Payer: Self-pay | Admitting: Nurse Practitioner

## 2023-10-23 VITALS — Resp 16 | Ht 70.0 in | Wt 170.0 lb

## 2023-10-23 DIAGNOSIS — F17210 Nicotine dependence, cigarettes, uncomplicated: Secondary | ICD-10-CM | POA: Diagnosis not present

## 2023-10-23 DIAGNOSIS — J0110 Acute frontal sinusitis, unspecified: Secondary | ICD-10-CM | POA: Diagnosis not present

## 2023-10-23 DIAGNOSIS — J449 Chronic obstructive pulmonary disease, unspecified: Secondary | ICD-10-CM | POA: Diagnosis not present

## 2023-10-23 DIAGNOSIS — Z8709 Personal history of other diseases of the respiratory system: Secondary | ICD-10-CM

## 2023-10-23 MED ORDER — AMOXICILLIN-POT CLAVULANATE 875-125 MG PO TABS
1.0000 | ORAL_TABLET | Freq: Two times a day (BID) | ORAL | 0 refills | Status: AC
Start: 2023-10-23 — End: 2023-11-02

## 2023-10-23 MED ORDER — ALBUTEROL SULFATE HFA 108 (90 BASE) MCG/ACT IN AERS
1.0000 | INHALATION_SPRAY | Freq: Four times a day (QID) | RESPIRATORY_TRACT | 5 refills | Status: DC | PRN
Start: 2023-10-23 — End: 2024-03-07

## 2023-10-23 NOTE — Progress Notes (Signed)
[facility] [address] [place], [place] [address]  Internal MEDICINE  Telephone Visit  Patient Name: Sara Cummings  [id number]  [id number]  Date of Service: 10/23/2023  I connected with the patient at 0900 by telephone and verified the patients identity using two identifiers.   I discussed the limitations, risks, security and privacy concerns of performing an evaluation and management service by telephone and the availability of in person appointments. I also discussed with the patient that there may be a patient responsible charge related to the service.  The patient expressed understanding and agrees to proceed.    Chief Complaint  Patient presents with   Telephone Screen    Very tired, no fever, sore throat for 2 day but better now. Just don't feel good.    Telephone Assessment    HPI Sara Cummings presents for a telephone visit for possible sinus infection and history of empyema Onset of symptoms was 2 days ago Reports fatigue, sore throat, chills, nasal congestion, decreased appetite, headaches, sinus pressure, runny nose, cough and sinus drainage.  Treated in hospital last year during the fall for empyema of right lower lung lobe. Last CT chest was done in November 2024.    Current Medication: Outpatient Encounter Medications as of 10/23/2023  Medication Sig   ALPRAZolam (XANAX) 0.25 MG tablet Take 1 tablet (0.25 mg total) by mouth daily as needed for anxiety.   amoxicillin-clavulanate (AUGMENTIN) 875-125 MG tablet Take 1 tablet by mouth 2 (two) times daily for 10 days. Take with food   benzonatate (TESSALON) 200 MG capsule Take 1 capsule (200 mg total) by mouth 3 (three) times daily as needed for cough.   buPROPion (ZYBAN) 150 MG 12 hr tablet Take 1 tablet (150 mg total) by mouth 2 (two) times daily.   cyclobenzaprine (FLEXERIL) 5 MG tablet Take 1 tablet (5 mg total) by mouth 3 (three) times daily as needed for muscle spasms.   levonorgestrel (MIRENA) 20 MCG/24HR  IUD 1 each by Intrauterine route once.   lisinopril (ZESTRIL) 10 MG tablet TAKE 1 TABLET(10 MG) BY MOUTH DAILY   potassium chloride SA (KLOR-CON M) 20 MEQ tablet Take 1 tablet (20 mEq total) by mouth 2 (two) times daily.   rosuvastatin (CRESTOR) 20 MG tablet TAKE 1 TABLET(20 MG) BY MOUTH DAILY   [DISCONTINUED] VENTOLIN HFA 108 (90 Base) MCG/ACT inhaler Inhale 1 puff into the lungs every 6 (six) hours as needed for wheezing or shortness of breath.   albuterol (VENTOLIN HFA) 108 (90 Base) MCG/ACT inhaler Inhale 1 puff into the lungs every 6 (six) hours as needed for wheezing or shortness of breath.   No facility-administered encounter medications on file as of 10/23/2023.    Surgical History: Past Surgical History:  Procedure Laterality Date   BREAST BIOPSY Left 10/09/2018   pending path   NO PAST SURGERIES      Medical History: Past Medical History:  Diagnosis Date   Anxiety    Hypercholesterolemia    Hypertension     Family History: Family History  Problem Relation Age of Onset   Lung cancer Father 26   Hypertension Mother    Heart disease Maternal Grandmother     Social History   Socioeconomic History   Marital status: Single    Spouse name: Not on file   Number of children: Not on file   Years of education: Not on file   Highest education level: Not on file  Occupational History   Not on file  Tobacco Use   Smoking status: Every Day    Current packs/day: 1.00    Types: Cigarettes   Smokeless tobacco: Never  Vaping Use   Vaping status: Never Used  Substance and Sexual Activity   Alcohol use: Yes    Comment: occ.   Drug use: No   Sexual activity: Not Currently    Birth control/protection: I.U.D.  Other Topics Concern   Not on file  Social History Narrative   Not on file   Social Drivers of Health   Financial Resource Strain: Not on file  Food Insecurity: No Food Insecurity (05/16/2023)   Hunger Vital Sign    Worried About Running Out of Food in the Last  Year: Never true    Ran Out of Food in the Last Year: Never true  Transportation Needs: No Transportation Needs (05/16/2023)   PRAPARE - Administrator, Civil Service (Medical): No    Lack of Transportation (Non-Medical): No  Physical Activity: Inactive (09/21/2017)   Exercise Vital Sign    Days of Exercise per Week: 0 days    Minutes of Exercise per Session: 0 min  Stress: No Stress Concern Present (09/21/2017)   [employer] of Occupational Health - Occupational Stress Questionnaire    Feeling of Stress : Only a little  Social Connections: Moderately Isolated (09/21/2017)   Social Connection and Isolation Panel [NHANES]    Frequency of Communication with Friends and Family: Three times a week    Frequency of Social Gatherings with Friends and Family: More than three times a week    Attends Religious Services: Never    Database administrator or Organizations: No    Attends Banker Meetings: Never    Marital Status: Never married  Intimate Partner Violence: Not At Risk (05/16/2023)   Humiliation, Afraid, Rape, and Kick questionnaire    Fear of Current or Ex-Partner: No    Emotionally Abused: No    Physically Abused: No    Sexually Abused: No      Review of Systems  Constitutional:  Positive for appetite change, chills and fatigue. Negative for fever.  HENT:  Positive for congestion, postnasal drip, rhinorrhea, sinus pressure and sore throat. Negative for sinus pain and sneezing.   Respiratory:  Positive for cough. Negative for chest tightness, shortness of breath and wheezing.   Cardiovascular: Negative.  Negative for chest pain and palpitations.  Neurological:  Positive for headaches.    Vital Signs: Resp 16   Ht 5\' 10"  (1.778 m)   Wt 170 lb (77.1 kg)   BMI 24.39 kg/m    Observation/Objective: She is alert and oriented. No acute distress noted.  Voice sounds hoarse.     Assessment/Plan: 1. Acute non-recurrent frontal sinusitis  (Primary) Augmentin prescribed, take until gone.  - amoxicillin-clavulanate (AUGMENTIN) 875-125 MG tablet; Take 1 tablet by mouth 2 (two) times daily for 10 days. Take with food  Dispense: 20 tablet; Refill: 0  2. Chronic obstructive pulmonary disease, unspecified COPD type (HCC) Continue prn albuterol inhaler as prescribed.  - albuterol (VENTOLIN HFA) 108 (90 Base) MCG/ACT inhaler; Inhale 1 puff into the lungs every 6 (six) hours as needed for wheezing or shortness of breath.  Dispense: 18 each; Refill: 5  3. History of pleural empyema Repeat CT chest ordered  - CT Chest W Contrast; Future  4. Cigarette smoker motivated to quit Bupropion is not helping, will discuss trying something else at her next visit.    General Counseling: Raynelle Fanning  verbalizes understanding of the findings of today's phone visit and agrees with plan of treatment. I have discussed any further diagnostic evaluation that may be needed or ordered today. We also reviewed her medications today. she has been encouraged to call the office with any questions or concerns that should arise related to todays visit.  Return if symptoms worsen or fail to improve, for has upcoming appt next month.   Orders Placed This Encounter  Procedures   CT Chest W Contrast    Meds ordered this encounter  Medications   amoxicillin-clavulanate (AUGMENTIN) 875-125 MG tablet    Sig: Take 1 tablet by mouth 2 (two) times daily for 10 days. Take with food    Dispense:  20 tablet    Refill:  0    Fill new script today   albuterol (VENTOLIN HFA) 108 (90 Base) MCG/ACT inhaler    Sig: Inhale 1 puff into the lungs every 6 (six) hours as needed for wheezing or shortness of breath.    Dispense:  18 each    Refill:  5    Generic is ok    Time spent:10 Minutes Time spent with patient included reviewing progress notes, labs, imaging studies, and discussing plan for follow up.  [place] Controlled Substance Database was reviewed by me for overdose risk  score (ORS) if appropriate.  This patient was seen by Sallyanne Kuster, FNP-C in collaboration with Dr. Beverely Risen as a part of collaborative care agreement.  Berea Majkowski R. Tedd Sias, MSN, FNP-C Internal medicine

## 2023-10-23 NOTE — Telephone Encounter (Signed)
Patient lvm for sick visit. No answer when I returned her call. Lvm to call office-Toni

## 2023-10-29 ENCOUNTER — Telehealth: Payer: Self-pay | Admitting: Nurse Practitioner

## 2023-10-29 NOTE — Telephone Encounter (Signed)
Chest CT was authorized by [facility] for [facility]. Patient called stating she wanted to have CT done @ [facility]. I cancelled [facility] authorization and started new PA for [facility]. [employer] denied. Lvm notifying patient-Toni

## 2023-11-13 ENCOUNTER — Telehealth: Payer: Self-pay | Admitting: Nurse Practitioner

## 2023-11-13 NOTE — Telephone Encounter (Signed)
 Received call from [facility] w/ [facility] radiology stating patient called them 11/08/23 to schedule chest CT for 11/15/23. I explained to her I had cancelled the authorization for their location due to patient stating she wanted to go to [facility]. I lvm for patient to return my call asap, before I submitted authorization again to [employer]-Toni

## 2023-11-14 ENCOUNTER — Telehealth: Payer: Self-pay | Admitting: Nurse Practitioner

## 2023-11-14 NOTE — Telephone Encounter (Signed)
 On 10/23/23, I obtained pa for chest CT7 @ [facility]. Order faxed.[phone]>>patient called stating she wants to go to [facility]. PA for [facility] cancelled. PA for BI pending..location denied by [facility],, stating criteria not met for the site of care. lvm notifying patient of denial..[phone]>>received call from [facility] stating patient scheduled for 11/15/23 for CT. I explained to them, pa had been cancelled for their location because patient wanted to go to [facility]. she stated patient called them 11/08/23 (after I notified patient that insurance denied BI) to schedule appointment. I lvm for patient to return my call asap...3.5.25>>patient's mother called stating patient was going to [facility] for the CT. I told her I was not going to cancel pa request again until she confirmed which facility she was having done, since she had not returned my calls. Berkley Harvey #[phone] for [facility]. Good thru 12/13/23. I notified mother and faxed  pa to [facility]-Toni

## 2023-11-15 ENCOUNTER — Ambulatory Visit: Payer: BLUE CROSS/BLUE SHIELD | Admitting: Nurse Practitioner

## 2023-11-15 ENCOUNTER — Encounter: Payer: Self-pay | Admitting: Nurse Practitioner

## 2023-11-15 VITALS — BP 116/78 | HR 87 | Temp 98.5°F | Resp 16 | Ht 70.0 in | Wt 179.6 lb

## 2023-11-15 DIAGNOSIS — J069 Acute upper respiratory infection, unspecified: Secondary | ICD-10-CM

## 2023-11-15 DIAGNOSIS — J449 Chronic obstructive pulmonary disease, unspecified: Secondary | ICD-10-CM

## 2023-11-15 DIAGNOSIS — U071 COVID-19: Secondary | ICD-10-CM

## 2023-11-15 DIAGNOSIS — F17210 Nicotine dependence, cigarettes, uncomplicated: Secondary | ICD-10-CM | POA: Diagnosis not present

## 2023-11-15 DIAGNOSIS — I10 Essential (primary) hypertension: Secondary | ICD-10-CM | POA: Diagnosis not present

## 2023-11-15 DIAGNOSIS — F419 Anxiety disorder, unspecified: Secondary | ICD-10-CM

## 2023-11-15 DIAGNOSIS — Z79899 Other long term (current) drug therapy: Secondary | ICD-10-CM

## 2023-11-15 MED ORDER — BUPROPION HCL ER (SMOKING DET) 150 MG PO TB12: 150.0000 mg | ORAL_TABLET | Freq: Two times a day (BID) | ORAL | 3 refills | Status: DC

## 2023-11-15 MED ORDER — AIRSUPRA 90-80 MCG/ACT IN AERO: 2.0000 | INHALATION_SPRAY | Freq: Four times a day (QID) | RESPIRATORY_TRACT | 3 refills | Status: DC | PRN

## 2023-11-15 MED ORDER — ALPRAZOLAM 0.25 MG PO TABS
0.2500 mg | ORAL_TABLET | Freq: Every day | ORAL | 2 refills | Status: DC | PRN
Start: 2023-11-15 — End: 2024-01-31

## 2023-11-15 MED ORDER — BENZONATATE 200 MG PO CAPS: 200.0000 mg | ORAL_CAPSULE | Freq: Three times a day (TID) | ORAL | 2 refills | Status: AC | PRN

## 2023-11-15 MED ORDER — LISINOPRIL 10 MG PO TABS
10.0000 mg | ORAL_TABLET | Freq: Every day | ORAL | 1 refills | Status: DC
Start: 2023-11-15 — End: 2024-03-07

## 2023-11-15 NOTE — Progress Notes (Signed)
 [facility] [address] [place], [place] [address]  Internal MEDICINE  Office Visit Note  Patient Name: Sara Cummings  [id number]  [id number]  Date of Service: 11/15/2023  Chief Complaint  Patient presents with   Hyperlipidemia   Hypertension   Follow-up    HPI Sara Cummings presents for a follow-up visit for COPD, covid, hypertension, anxiety, and smoking cessation.  COPD -- asking for a rescue inhaler that works better than plain albuterol .  Recent COVID URI -- slowly recovering, still having a persistent cough.  Hypertension -- controlled with lisinopril   Anxiety --taking alprazolam  as needed and is taking venlafaxine  daily. Smoking cessation -- wants to continue bupropion  for smoking cessation.     Current Medication: Outpatient Encounter Medications as of 11/15/2023  Medication Sig   albuterol  (VENTOLIN  HFA) 108 (90 Base) MCG/ACT inhaler Inhale 1 puff into the lungs every 6 (six) hours as needed for wheezing or shortness of breath.   cyclobenzaprine  (FLEXERIL ) 5 MG tablet Take 1 tablet (5 mg total) by mouth 3 (three) times daily as needed for muscle spasms.   levonorgestrel  (MIRENA ) 20 MCG/24HR IUD 1 each by Intrauterine route once.   rosuvastatin  (CRESTOR ) 20 MG tablet TAKE 1 TABLET(20 MG) BY MOUTH DAILY   [DISCONTINUED] ALPRAZolam  (XANAX ) 0.25 MG tablet Take 1 tablet (0.25 mg total) by mouth daily as needed for anxiety.   [DISCONTINUED] benzonatate  (TESSALON ) 200 MG capsule Take 1 capsule (200 mg total) by mouth 3 (three) times daily as needed for cough.   [DISCONTINUED] buPROPion  (ZYBAN ) 150 MG 12 hr tablet Take 1 tablet (150 mg total) by mouth 2 (two) times daily.   [DISCONTINUED] lisinopril  (ZESTRIL ) 10 MG tablet TAKE 1 TABLET(10 MG) BY MOUTH DAILY   [DISCONTINUED] potassium chloride  SA (KLOR-CON  M) 20 MEQ tablet Take 1 tablet (20 mEq total) by mouth 2 (two) times daily.   Albuterol -Budesonide  (AIRSUPRA ) 90-80 MCG/ACT AERO Inhale 2 puffs into the lungs every 6  (six) hours as needed (SOB/wheezing).   ALPRAZolam  (XANAX ) 0.25 MG tablet Take 1 tablet (0.25 mg total) by mouth daily as needed for anxiety.   benzonatate  (TESSALON ) 200 MG capsule Take 1 capsule (200 mg total) by mouth 3 (three) times daily as needed for cough.   buPROPion  (ZYBAN ) 150 MG 12 hr tablet Take 1 tablet (150 mg total) by mouth 2 (two) times daily.   lisinopril  (ZESTRIL ) 10 MG tablet Take 1 tablet (10 mg total) by mouth daily.   No facility-administered encounter medications on file as of 11/15/2023.    Surgical History: Past Surgical History:  Procedure Laterality Date   BREAST BIOPSY Left 10/09/2018   pending path   NO PAST SURGERIES      Medical History: Past Medical History:  Diagnosis Date   Anxiety    Hypercholesterolemia    Hypertension     Family History: Family History  Problem Relation Age of Onset   Lung cancer Father 98   Hypertension Mother    Heart disease Maternal Grandmother     Social History   Socioeconomic History   Marital status: Single    Spouse name: Not on file   Number of children: Not on file   Years of education: Not on file   Highest education level: Not on file  Occupational History   Not on file  Tobacco Use   Smoking status: Every Day    Current packs/day: 1.00    Types: Cigarettes   Smokeless tobacco: Never  Vaping Use   Vaping status: Never  Used  Substance and Sexual Activity   Alcohol use: Yes    Comment: occ.   Drug use: No   Sexual activity: Not Currently    Birth control/protection: I.U.D.  Other Topics Concern   Not on file  Social History Narrative   Not on file   Social Drivers of Health   Financial Resource Strain: Not on file  Food Insecurity: No Food Insecurity (05/16/2023)   Hunger Vital Sign    Worried About Running Out of Food in the Last Year: Never true    Ran Out of Food in the Last Year: Never true  Transportation Needs: No Transportation Needs (05/16/2023)   PRAPARE - Doctor, general practice (Medical): No    Lack of Transportation (Non-Medical): No  Physical Activity: Inactive (09/21/2017)   Exercise Vital Sign    Days of Exercise per Week: 0 days    Minutes of Exercise per Session: 0 min  Stress: No Stress Concern Present (09/21/2017)   [employer] of Occupational Health - Occupational Stress Questionnaire    Feeling of Stress : Only a little  Social Connections: Moderately Isolated (09/21/2017)   Social Connection and Isolation Panel [NHANES]    Frequency of Communication with Friends and Family: Three times a week    Frequency of Social Gatherings with Friends and Family: More than three times a week    Attends Religious Services: Never    Database administrator or Organizations: No    Attends Banker Meetings: Never    Marital Status: Never married  Intimate Partner Violence: Not At Risk (05/16/2023)   Humiliation, Afraid, Rape, and Kick questionnaire    Fear of Current or Ex-Partner: No    Emotionally Abused: No    Physically Abused: No    Sexually Abused: No      Review of Systems  Constitutional:  Positive for appetite change and fatigue. Negative for activity change and unexpected weight change.  HENT:  Positive for congestion and sinus pressure.   Respiratory:  Positive for cough. Negative for chest tightness, shortness of breath and wheezing.   Cardiovascular:  Negative for chest pain and palpitations.  Gastrointestinal: Negative.   Genitourinary: Negative.   Musculoskeletal:  Positive for arthralgias.  Neurological:  Positive for headaches.    Vital Signs: BP 116/78   Pulse 87   Temp 98.5 F (36.9 C)   Resp 16   Ht 5\' 10"  (1.778 m)   Wt 179 lb 9.6 oz (81.5 kg)   SpO2 99%   BMI 25.77 kg/m    Physical Exam Vitals reviewed.  Constitutional:      General: She is not in acute distress.    Appearance: Normal appearance. She is not ill-appearing.  HENT:     Head: Normocephalic and atraumatic.  Eyes:      Pupils: Pupils are equal, round, and reactive to light.  Cardiovascular:     Rate and Rhythm: Normal rate and regular rhythm.     Heart sounds: Normal heart sounds. No murmur heard. Pulmonary:     Effort: Pulmonary effort is normal. No respiratory distress.     Breath sounds: Normal breath sounds. No wheezing.  Neurological:     Mental Status: She is alert and oriented to person, place, and time.  Psychiatric:        Mood and Affect: Mood normal.        Behavior: Behavior normal.        Assessment/Plan: 1. Chronic obstructive  pulmonary disease, unspecified COPD type (HCC) (Primary) Airsupra  prescribed, sample given to patient.  - Albuterol -Budesonide  (AIRSUPRA ) 90-80 MCG/ACT AERO; Inhale 2 puffs into the lungs every 6 (six) hours as needed (SOB/wheezing).  Dispense: 17.7 g; Refill: 3  2. Essential hypertension, benign Stable, continue lisinopril  as prescribed.  - lisinopril  (ZESTRIL ) 10 MG tablet; Take 1 tablet (10 mg total) by mouth daily.  Dispense: 90 tablet; Refill: 1  3. Upper respiratory tract infection due to COVID-19 virus Continue prn benzonatate  as prescribed.  - benzonatate  (TESSALON ) 200 MG capsule; Take 1 capsule (200 mg total) by mouth 3 (three) times daily as needed for cough.  Dispense: 30 capsule; Refill: 2  4. Cigarette smoker motivated to quit Continue bupropion  as prescribed.  - buPROPion  (ZYBAN ) 150 MG 12 hr tablet; Take 1 tablet (150 mg total) by mouth 2 (two) times daily.  Dispense: 60 tablet; Refill: 3  5. Anxiety Continue venlafaxine  daily and prn alprazolam  as prescribed. Follow up in 3 months for additonal alprazolam  refills.  - ALPRAZolam  (XANAX ) 0.25 MG tablet; Take 1 tablet (0.25 mg total) by mouth daily as needed for anxiety.  Dispense: 60 tablet; Refill: 2   General Counseling: loucille takach understanding of the findings of todays visit and agrees with plan of treatment. I have discussed any further diagnostic evaluation that may be needed or  ordered today. We also reviewed her medications today. she has been encouraged to call the office with any questions or concerns that should arise related to todays visit.    No orders of the defined types were placed in this encounter.   Meds ordered this encounter  Medications   buPROPion  (ZYBAN ) 150 MG 12 hr tablet    Sig: Take 1 tablet (150 mg total) by mouth 2 (two) times daily.    Dispense:  60 tablet    Refill:  3    Fill new script   ALPRAZolam  (XANAX ) 0.25 MG tablet    Sig: Take 1 tablet (0.25 mg total) by mouth daily as needed for anxiety.    Dispense:  60 tablet    Refill:  2    Fill script   lisinopril  (ZESTRIL ) 10 MG tablet    Sig: Take 1 tablet (10 mg total) by mouth daily.    Dispense:  90 tablet    Refill:  1   benzonatate  (TESSALON ) 200 MG capsule    Sig: Take 1 capsule (200 mg total) by mouth 3 (three) times daily as needed for cough.    Dispense:  30 capsule    Refill:  2    Fill new script today   Albuterol -Budesonide  (AIRSUPRA ) 90-80 MCG/ACT AERO    Sig: Inhale 2 puffs into the lungs every 6 (six) hours as needed (SOB/wheezing).    Dispense:  17.7 g    Refill:  3    Fill new script, give 3 inhaler at a time, patient will have a copay card.    Return in about 3 months (around 02/15/2024) for F/U, anxiety med refill, Ilse Billman PCP.   Total time spent:30 Minutes Time spent includes review of chart, medications, test results, and follow up plan with the patient.   [place] Controlled Substance Database was reviewed by me.  This patient was seen by Laurence Pons, FNP-C in collaboration with Dr. Verneta Gone as a part of collaborative care agreement.   Clinten Howk R. Bobbi Burow, MSN, FNP-C Internal medicine

## 2023-12-10 ENCOUNTER — Telehealth: Payer: Self-pay | Admitting: Nurse Practitioner

## 2023-12-10 NOTE — Telephone Encounter (Signed)
 Lvm w/ patient's mother to schedule follow up appointment to review CT results-Toni

## 2023-12-17 ENCOUNTER — Encounter: Payer: Self-pay | Admitting: Nurse Practitioner

## 2023-12-17 ENCOUNTER — Telehealth (INDEPENDENT_AMBULATORY_CARE_PROVIDER_SITE_OTHER): Admitting: Nurse Practitioner

## 2023-12-17 ENCOUNTER — Telehealth: Payer: Self-pay | Admitting: Nurse Practitioner

## 2023-12-17 VITALS — Resp 16 | Ht 70.0 in | Wt 175.0 lb

## 2023-12-17 DIAGNOSIS — F17210 Nicotine dependence, cigarettes, uncomplicated: Secondary | ICD-10-CM

## 2023-12-17 DIAGNOSIS — Z8709 Personal history of other diseases of the respiratory system: Secondary | ICD-10-CM | POA: Diagnosis not present

## 2023-12-17 DIAGNOSIS — J301 Allergic rhinitis due to pollen: Secondary | ICD-10-CM | POA: Diagnosis not present

## 2023-12-17 DIAGNOSIS — F419 Anxiety disorder, unspecified: Secondary | ICD-10-CM | POA: Diagnosis not present

## 2023-12-17 MED ORDER — VENLAFAXINE HCL ER 75 MG PO CP24: 75.0000 mg | ORAL_CAPSULE | Freq: Every day | ORAL | 1 refills | Status: AC

## 2023-12-17 MED ORDER — VARENICLINE TARTRATE (STARTER) 0.5 MG X 11 & 1 MG X 42 PO TBPK: ORAL_TABLET | ORAL | 0 refills | Status: DC

## 2023-12-17 NOTE — Telephone Encounter (Signed)
 Attempted to contact patient to schedule f/u appointment. No answer. Mb full. Mother will have patient call office-Toni

## 2023-12-17 NOTE — Progress Notes (Signed)
 [facility] [address] [place], [place] [address]  Internal MEDICINE  Telephone Visit  Patient Name: Sara Cummings  [id number]  [id number]  Date of Service: 12/17/2023  I connected with the patient at 1225 by telephone and verified the patients identity using two identifiers.   I discussed the limitations, risks, security and privacy concerns of performing an evaluation and management service by telephone and the availability of in person appointments. I also discussed with the patient that there may be a patient responsible charge related to the service.  The patient expressed understanding and agrees to proceed.    Chief Complaint  Patient presents with   Telephone Screen    Review CT   Telephone Assessment    HPI Sara Cummings presents for a telehealth virtual visit for CT chest results. CT chest results -- area of concern is continuing to improve and resolve. No new areas of consolidation and no new suspicious nodules.  Smoking cessation -- bupropion is not helping with her nicotine cravings, wants to try something else to help with quitting smoking.  Anxiety and depression -- wants to try effexor again which worked well in the past.     Current Medication: Outpatient Encounter Medications as of 12/17/2023  Medication Sig   albuterol (VENTOLIN HFA) 108 (90 Base) MCG/ACT inhaler Inhale 1 puff into the lungs every 6 (six) hours as needed for wheezing or shortness of breath.   Albuterol-Budesonide (AIRSUPRA) 90-80 MCG/ACT AERO Inhale 2 puffs into the lungs every 6 (six) hours as needed (SOB/wheezing).   ALPRAZolam (XANAX) 0.25 MG tablet Take 1 tablet (0.25 mg total) by mouth daily as needed for anxiety.   benzonatate (TESSALON) 200 MG capsule Take 1 capsule (200 mg total) by mouth 3 (three) times daily as needed for cough.   buPROPion (ZYBAN) 150 MG 12 hr tablet Take 1 tablet (150 mg total) by mouth 2 (two) times daily.   cyclobenzaprine (FLEXERIL) 5 MG tablet Take 1  tablet (5 mg total) by mouth 3 (three) times daily as needed for muscle spasms.   levonorgestrel (MIRENA) 20 MCG/24HR IUD 1 each by Intrauterine route once.   lisinopril (ZESTRIL) 10 MG tablet Take 1 tablet (10 mg total) by mouth daily.   rosuvastatin (CRESTOR) 20 MG tablet TAKE 1 TABLET(20 MG) BY MOUTH DAILY   Varenicline Tartrate, Starter, 0.5 MG X 11 & 1 MG X 42 TBPK Take 0.5 mg by mouth once daily on days 1-3, then take 0.5 mg twice daily on days 4-7, then increase to 1 mg twice daily   venlafaxine XR (EFFEXOR-XR) 75 MG 24 hr capsule Take 1 capsule (75 mg total) by mouth daily with breakfast.   No facility-administered encounter medications on file as of 12/17/2023.    Surgical History: Past Surgical History:  Procedure Laterality Date   BREAST BIOPSY Left 10/09/2018   pending path   NO PAST SURGERIES      Medical History: Past Medical History:  Diagnosis Date   Anxiety    Hypercholesterolemia    Hypertension     Family History: Family History  Problem Relation Age of Onset   Lung cancer Father 49   Hypertension Mother    Heart disease Maternal Grandmother     Social History   Socioeconomic History   Marital status: Single    Spouse name: Not on file   Number of children: Not on file   Years of education: Not on file   Highest education level: Not on file  Occupational  History   Not on file  Tobacco Use   Smoking status: Every Day    Current packs/day: 1.00    Types: Cigarettes   Smokeless tobacco: Never  Vaping Use   Vaping status: Never Used  Substance and Sexual Activity   Alcohol use: Yes    Comment: occ.   Drug use: No   Sexual activity: Not Currently    Birth control/protection: I.U.D.  Other Topics Concern   Not on file  Social History Narrative   Not on file   Social Drivers of Health   Financial Resource Strain: Not on file  Food Insecurity: No Food Insecurity (05/16/2023)   Hunger Vital Sign    Worried About Running Out of Food in the Last  Year: Never true    Ran Out of Food in the Last Year: Never true  Transportation Needs: No Transportation Needs (05/16/2023)   PRAPARE - Administrator, Civil Service (Medical): No    Lack of Transportation (Non-Medical): No  Physical Activity: Inactive (09/21/2017)   Exercise Vital Sign    Days of Exercise per Week: 0 days    Minutes of Exercise per Session: 0 min  Stress: No Stress Concern Present (09/21/2017)   [employer] of Occupational Health - Occupational Stress Questionnaire    Feeling of Stress : Only a little  Social Connections: Moderately Isolated (09/21/2017)   Social Connection and Isolation Panel [NHANES]    Frequency of Communication with Friends and Family: Three times a week    Frequency of Social Gatherings with Friends and Family: More than three times a week    Attends Religious Services: Never    Database administrator or Organizations: No    Attends Banker Meetings: Never    Marital Status: Never married  Intimate Partner Violence: Not At Risk (05/16/2023)   Humiliation, Afraid, Rape, and Kick questionnaire    Fear of Current or Ex-Partner: No    Emotionally Abused: No    Physically Abused: No    Sexually Abused: No      Review of Systems  Constitutional:  Negative for fatigue.  HENT:  Positive for congestion, postnasal drip, rhinorrhea and sneezing.   Eyes:  Positive for discharge (watery eyes) and itching.  Respiratory:  Negative for cough, chest tightness, shortness of breath and wheezing.   Cardiovascular: Negative.  Negative for chest pain and palpitations.  Gastrointestinal: Negative.   Neurological: Negative.   Psychiatric/Behavioral:  Negative for self-injury and suicidal ideas. The patient is nervous/anxious.     Vital Signs: Resp 16   Ht 5\' 10"  (1.778 m)   Wt 175 lb (79.4 kg)   BMI 25.11 kg/m    Observation/Objective: She is alert and oriented. No acute distress noted.     Assessment/Plan: 1. Seasonal  allergic rhinitis due to pollen (Primary) Taking OTC allergy medication, may continue this   2. History of pleural empyema Reviewed CT chest results, will repeat scan in 6-12 months   3. Cigarette smoker motivated to quit Discontinue bupropion. Start chantix as prescribed.  - Varenicline Tartrate, Starter, 0.5 MG X 11 & 1 MG X 42 TBPK; Take 0.5 mg by mouth once daily on days 1-3, then take 0.5 mg twice daily on days 4-7, then increase to 1 mg twice daily  Dispense: 53 each; Refill: 0  4. Anxiety Discontinue bupropion, start venlafaxine as prescribed.  - venlafaxine XR (EFFEXOR-XR) 75 MG 24 hr capsule; Take 1 capsule (75 mg total) by mouth daily  with breakfast.  Dispense: 90 capsule; Refill: 1   General Counseling: annelies coyt understanding of the findings of today's phone visit and agrees with plan of treatment. I have discussed any further diagnostic evaluation that may be needed or ordered today. We also reviewed her medications today. she has been encouraged to call the office with any questions or concerns that should arise related to todays visit.  Return in about 4 weeks (around 01/14/2024) for F/U, Josepha Barbier PCP, eval new med venlafaxine and chantix.   No orders of the defined types were placed in this encounter.   Meds ordered this encounter  Medications   Varenicline Tartrate, Starter, 0.5 MG X 11 & 1 MG X 42 TBPK    Sig: Take 0.5 mg by mouth once daily on days 1-3, then take 0.5 mg twice daily on days 4-7, then increase to 1 mg twice daily    Dispense:  53 each    Refill:  0    Fill new script today   venlafaxine XR (EFFEXOR-XR) 75 MG 24 hr capsule    Sig: Take 1 capsule (75 mg total) by mouth daily with breakfast.    Dispense:  90 capsule    Refill:  1    Fill new script today    Time spent:20 Minutes Time spent with patient included reviewing progress notes, labs, imaging studies, and discussing plan for follow up.  [place] Controlled Substance Database was reviewed by  me for overdose risk score (ORS) if appropriate.  This patient was seen by Sallyanne Kuster, FNP-C in collaboration with Dr. Beverely Risen as a part of collaborative care agreement.  Sonna Lipsky R. Tedd Sias, MSN, FNP-C Internal medicine

## 2023-12-29 ENCOUNTER — Encounter: Payer: Self-pay | Admitting: Nurse Practitioner

## 2024-01-17 ENCOUNTER — Ambulatory Visit: Admitting: Nurse Practitioner

## 2024-01-19 DIAGNOSIS — F418 Other specified anxiety disorders: Secondary | ICD-10-CM | POA: Insufficient documentation

## 2024-01-31 ENCOUNTER — Ambulatory Visit (INDEPENDENT_AMBULATORY_CARE_PROVIDER_SITE_OTHER): Admitting: Nurse Practitioner

## 2024-01-31 ENCOUNTER — Encounter: Payer: Self-pay | Admitting: Nurse Practitioner

## 2024-01-31 VITALS — BP 110/68 | HR 93 | Temp 98.2°F | Resp 16 | Ht 70.0 in | Wt 174.4 lb

## 2024-01-31 DIAGNOSIS — F419 Anxiety disorder, unspecified: Secondary | ICD-10-CM

## 2024-01-31 DIAGNOSIS — F17210 Nicotine dependence, cigarettes, uncomplicated: Secondary | ICD-10-CM | POA: Diagnosis not present

## 2024-01-31 DIAGNOSIS — I10 Essential (primary) hypertension: Secondary | ICD-10-CM

## 2024-01-31 DIAGNOSIS — E782 Mixed hyperlipidemia: Secondary | ICD-10-CM | POA: Diagnosis not present

## 2024-01-31 DIAGNOSIS — M542 Cervicalgia: Secondary | ICD-10-CM

## 2024-01-31 DIAGNOSIS — F1021 Alcohol dependence, in remission: Secondary | ICD-10-CM

## 2024-01-31 DIAGNOSIS — Z79899 Other long term (current) drug therapy: Secondary | ICD-10-CM

## 2024-01-31 MED ORDER — ROSUVASTATIN CALCIUM 20 MG PO TABS
20.0000 mg | ORAL_TABLET | Freq: Every day | ORAL | 1 refills | Status: AC
Start: 2024-01-31 — End: ?

## 2024-01-31 MED ORDER — CYCLOBENZAPRINE HCL 5 MG PO TABS: 5.0000 mg | ORAL_TABLET | Freq: Three times a day (TID) | ORAL | 1 refills | Status: AC | PRN

## 2024-01-31 MED ORDER — ALPRAZOLAM 0.25 MG PO TABS
0.2500 mg | ORAL_TABLET | Freq: Every day | ORAL | 0 refills | Status: DC | PRN
Start: 2024-01-31 — End: 2024-03-07

## 2024-01-31 NOTE — Progress Notes (Signed)
 [facility] [address] [place], [place] [address]  Internal MEDICINE  Office Visit Note  Patient Name: Sara Cummings  [id number]  [id number]  Date of Service: 01/31/2024  Chief Complaint  Patient presents with   Hyperlipidemia   Hypertension   Follow-up    HPI Sara Cummings presents for a follow-up visit for anxiety, depression, alcohol use and neck pain, hypertension and high cholesterol.  Alcohol use disorder -- seen in ER for excessive alcohol use, voluntarily went to ER in [place] to get help for this. She was put on chlordiazepoxide to help keep her from drinking. She has not had an alcoholic drink in 2 weeks.   Anxiety -- taking venlafaxine  which is significantly improving her anxiety. She also has alprazolam  as needed which helps as well when the anxiety is severe.  Depression -- significantly improved with venlafaxine .  Lost job after 8 months, was drinking vodka excessively for a couple of months  Hypertension -- controlled on lisinopril  High cholesterol -- taking rosuvastatin .     Current Medication: Outpatient Encounter Medications as of 01/31/2024  Medication Sig   albuterol  (VENTOLIN  HFA) 108 (90 Base) MCG/ACT inhaler Inhale 1 puff into the lungs every 6 (six) hours as needed for wheezing or shortness of breath.   Albuterol -Budesonide  (AIRSUPRA ) 90-80 MCG/ACT AERO Inhale 2 puffs into the lungs every 6 (six) hours as needed (SOB/wheezing).   benzonatate  (TESSALON ) 200 MG capsule Take 1 capsule (200 mg total) by mouth 3 (three) times daily as needed for cough.   buPROPion  (ZYBAN ) 150 MG 12 hr tablet Take 1 tablet (150 mg total) by mouth 2 (two) times daily.   levonorgestrel  (MIRENA ) 20 MCG/24HR IUD 1 each by Intrauterine route once.   lisinopril  (ZESTRIL ) 10 MG tablet Take 1 tablet (10 mg total) by mouth daily.   venlafaxine  XR (EFFEXOR -XR) 75 MG 24 hr capsule Take 1 capsule (75 mg total) by mouth daily with breakfast.   [DISCONTINUED] ALPRAZolam  (XANAX )  0.25 MG tablet Take 1 tablet (0.25 mg total) by mouth daily as needed for anxiety.   [DISCONTINUED] cyclobenzaprine  (FLEXERIL ) 5 MG tablet Take 1 tablet (5 mg total) by mouth 3 (three) times daily as needed for muscle spasms.   [DISCONTINUED] rosuvastatin  (CRESTOR ) 20 MG tablet TAKE 1 TABLET(20 MG) BY MOUTH DAILY   [DISCONTINUED] Varenicline  Tartrate, Starter, 0.5 MG X 11 & 1 MG X 42 TBPK Take 0.5 mg by mouth once daily on days 1-3, then take 0.5 mg twice daily on days 4-7, then increase to 1 mg twice daily   ALPRAZolam  (XANAX ) 0.25 MG tablet Take 1 tablet (0.25 mg total) by mouth daily as needed for anxiety.   cyclobenzaprine  (FLEXERIL ) 5 MG tablet Take 1 tablet (5 mg total) by mouth 3 (three) times daily as needed for muscle spasms.   rosuvastatin  (CRESTOR ) 20 MG tablet Take 1 tablet (20 mg total) by mouth daily.   No facility-administered encounter medications on file as of 01/31/2024.    Surgical History: Past Surgical History:  Procedure Laterality Date   BREAST BIOPSY Left 10/09/2018   pending path   NO PAST SURGERIES      Medical History: Past Medical History:  Diagnosis Date   Anxiety    Hypercholesterolemia    Hypertension     Family History: Family History  Problem Relation Age of Onset   Lung cancer Father 58   Hypertension Mother    Heart disease Maternal Grandmother     Social History   Socioeconomic History  Marital status: Single    Spouse name: Not on file   Number of children: Not on file   Years of education: Not on file   Highest education level: Not on file  Occupational History   Not on file  Tobacco Use   Smoking status: Every Day    Current packs/day: 1.00    Types: Cigarettes   Smokeless tobacco: Never  Vaping Use   Vaping status: Never Used  Substance and Sexual Activity   Alcohol use: Not Currently    Comment: occ.   Drug use: No   Sexual activity: Not Currently    Birth control/protection: I.U.D.  Other Topics Concern   Not on file   Social History Narrative   Not on file   Social Drivers of Health   Financial Resource Strain: Not on file  Food Insecurity: No Food Insecurity (05/16/2023)   Hunger Vital Sign    Worried About Running Out of Food in the Last Year: Never true    Ran Out of Food in the Last Year: Never true  Transportation Needs: No Transportation Needs (05/16/2023)   PRAPARE - Administrator, Civil Service (Medical): No    Lack of Transportation (Non-Medical): No  Physical Activity: Inactive (09/21/2017)   Exercise Vital Sign    Days of Exercise per Week: 0 days    Minutes of Exercise per Session: 0 min  Stress: No Stress Concern Present (09/21/2017)   [employer] of Occupational Health - Occupational Stress Questionnaire    Feeling of Stress : Only a little  Social Connections: Moderately Isolated (09/21/2017)   Social Connection and Isolation Panel [NHANES]    Frequency of Communication with Friends and Family: Three times a week    Frequency of Social Gatherings with Friends and Family: More than three times a week    Attends Religious Services: Never    Database administrator or Organizations: No    Attends Banker Meetings: Never    Marital Status: Never married  Intimate Partner Violence: Not At Risk (05/16/2023)   Humiliation, Afraid, Rape, and Kick questionnaire    Fear of Current or Ex-Partner: No    Emotionally Abused: No    Physically Abused: No    Sexually Abused: No      Review of Systems  Constitutional:  Positive for fatigue. Negative for activity change, appetite change and unexpected weight change.  HENT:  Negative for congestion and sinus pressure.   Respiratory:  Negative for cough, chest tightness, shortness of breath and wheezing.   Cardiovascular:  Negative for chest pain and palpitations.  Gastrointestinal: Negative.   Genitourinary: Negative.   Musculoskeletal:  Positive for arthralgias and neck pain.  Neurological:  Negative for headaches.   Psychiatric/Behavioral:  Positive for behavioral problems and sleep disturbance. Negative for self-injury and suicidal ideas. The patient is nervous/anxious.     Vital Signs: BP 110/68   Pulse 93   Temp 98.2 F (36.8 C)   Resp 16   Ht 5' 10 (1.778 m)   Wt 174 lb 6.4 oz (79.1 kg)   SpO2 94%   BMI 25.02 kg/m    Physical Exam Vitals reviewed.  Constitutional:      General: She is not in acute distress.    Appearance: Normal appearance. She is not ill-appearing.  HENT:     Head: Normocephalic and atraumatic.  Eyes:     Pupils: Pupils are equal, round, and reactive to light.  Cardiovascular:  Rate and Rhythm: Normal rate and regular rhythm.     Heart sounds: Normal heart sounds. No murmur heard. Pulmonary:     Effort: Pulmonary effort is normal. No respiratory distress.     Breath sounds: Normal breath sounds. No wheezing.  Neurological:     Mental Status: She is alert and oriented to person, place, and time.  Psychiatric:        Mood and Affect: Mood normal.        Behavior: Behavior normal.        Assessment/Plan: 1. Essential hypertension, benign (Primary) Stable, continue lisinopril  as prescribed.   2. Mixed hyperlipidemia Continue rosuvastatin  as prescribed.  - rosuvastatin  (CRESTOR ) 20 MG tablet; Take 1 tablet (20 mg total) by mouth daily.  Dispense: 90 tablet; Refill: 1  3. Cervicalgia Take cyclobenzaprine  as needed as prescribed.  - cyclobenzaprine  (FLEXERIL ) 5 MG tablet; Take 1 tablet (5 mg total) by mouth 3 (three) times daily as needed for muscle spasms.  Dispense: 30 tablet; Refill: 1  4. Cigarette smoker motivated to quit She is working on quitting now on her own.   5. Alcohol use disorder, moderate, in early remission (HCC) She has stopped drinking alcohol, has been sober for 2 weeks. Reports no longer craving alcohol. She was treated with librium for alcohol withdrawal initially.   6. Anxiety Continue venlafaxine  as prescribed    General  Counseling: gracen ringwald understanding of the findings of todays visit and agrees with plan of treatment. I have discussed any further diagnostic evaluation that may be needed or ordered today. We also reviewed her medications today. she has been encouraged to call the office with any questions or concerns that should arise related to todays visit.    No orders of the defined types were placed in this encounter.   Meds ordered this encounter  Medications   rosuvastatin  (CRESTOR ) 20 MG tablet    Sig: Take 1 tablet (20 mg total) by mouth daily.    Dispense:  90 tablet    Refill:  1    ZERO refills remain on this prescription. Your patient is requesting advance approval of refills for this medication to PREVENT ANY MISSED DOSES   cyclobenzaprine  (FLEXERIL ) 5 MG tablet    Sig: Take 1 tablet (5 mg total) by mouth 3 (three) times daily as needed for muscle spasms.    Dispense:  30 tablet    Refill:  1   ALPRAZolam  (XANAX ) 0.25 MG tablet    Sig: Take 1 tablet (0.25 mg total) by mouth daily as needed for anxiety.    Dispense:  60 tablet    Refill:  0    Fill script    Return for please push 6/5 appt out to late june.   Total time spent:30 Minutes Time spent includes review of chart, medications, test results, and follow up plan with the patient.   [place] Controlled Substance Database was reviewed by me.  This patient was seen by Mardy Maxin, FNP-C in collaboration with Dr. Sigrid Bathe as a part of collaborative care agreement.   Chalyn Amescua R. Maxin, MSN, FNP-C Internal medicine

## 2024-02-14 ENCOUNTER — Ambulatory Visit: Admitting: Nurse Practitioner

## 2024-03-07 ENCOUNTER — Ambulatory Visit (INDEPENDENT_AMBULATORY_CARE_PROVIDER_SITE_OTHER): Admitting: Nurse Practitioner

## 2024-03-07 ENCOUNTER — Encounter: Payer: Self-pay | Admitting: Nurse Practitioner

## 2024-03-07 VITALS — BP 132/76 | HR 82 | Temp 98.3°F | Resp 16 | Ht 70.0 in | Wt 169.8 lb

## 2024-03-07 DIAGNOSIS — F1021 Alcohol dependence, in remission: Secondary | ICD-10-CM | POA: Insufficient documentation

## 2024-03-07 DIAGNOSIS — I10 Essential (primary) hypertension: Secondary | ICD-10-CM | POA: Diagnosis not present

## 2024-03-07 DIAGNOSIS — E782 Mixed hyperlipidemia: Secondary | ICD-10-CM

## 2024-03-07 DIAGNOSIS — J449 Chronic obstructive pulmonary disease, unspecified: Secondary | ICD-10-CM | POA: Diagnosis not present

## 2024-03-07 DIAGNOSIS — F419 Anxiety disorder, unspecified: Secondary | ICD-10-CM | POA: Diagnosis not present

## 2024-03-07 MED ORDER — ALPRAZOLAM 0.25 MG PO TABS: 0.2500 mg | ORAL_TABLET | Freq: Every day | ORAL | 2 refills | Status: AC | PRN

## 2024-03-07 MED ORDER — ALBUTEROL SULFATE HFA 108 (90 BASE) MCG/ACT IN AERS
1.0000 | INHALATION_SPRAY | Freq: Four times a day (QID) | RESPIRATORY_TRACT | 5 refills | Status: AC | PRN
Start: 2024-03-07 — End: ?

## 2024-03-07 MED ORDER — LISINOPRIL 10 MG PO TABS: 10.0000 mg | ORAL_TABLET | Freq: Every day | ORAL | 1 refills | Status: DC

## 2024-03-07 NOTE — Progress Notes (Signed)
 [facility] [address] [place], [place] [address]  Internal MEDICINE  Office Visit Note  Patient Name: Sara Cummings  [id number]  [id number]  Date of Service: 03/07/2024  Chief Complaint  Patient presents with   Hypertension   Hyperlipidemia   Follow-up    HPI Schelly presents for a follow-up visit for hypertension, high cholesterol, alcohol use, anxiety and depression.  Anxiety -- taking alprazolam  as needed which helps. Also started venlafaxine  which is effective in helping to control her symptoms. She does not need the alprazolam  every day. Depression venlafaxine  is helping and her depressive symptoms are improving.  Alcohol use, still no alcohol use since may 10 per patient report. She is alert and oriented and does not show any signs of being under the influence. She reports feeling much better since she stopped drinking.  Hypertension -- controlled with lisinopril  High cholesterol -- taking rosuvastatin     Current Medication: Outpatient Encounter Medications as of 03/07/2024  Medication Sig   albuterol  (VENTOLIN  HFA) 108 (90 Base) MCG/ACT inhaler Inhale 1 puff into the lungs every 6 (six) hours as needed for wheezing or shortness of breath.   ALPRAZolam  (XANAX ) 0.25 MG tablet Take 1 tablet (0.25 mg total) by mouth daily as needed for anxiety.   benzonatate  (TESSALON ) 200 MG capsule Take 1 capsule (200 mg total) by mouth 3 (three) times daily as needed for cough.   cyclobenzaprine  (FLEXERIL ) 5 MG tablet Take 1 tablet (5 mg total) by mouth 3 (three) times daily as needed for muscle spasms.   levonorgestrel  (MIRENA ) 20 MCG/24HR IUD 1 each by Intrauterine route once.   lisinopril  (ZESTRIL ) 10 MG tablet Take 1 tablet (10 mg total) by mouth daily.   rosuvastatin  (CRESTOR ) 20 MG tablet Take 1 tablet (20 mg total) by mouth daily.   venlafaxine  XR (EFFEXOR -XR) 75 MG 24 hr capsule Take 1 capsule (75 mg total) by mouth daily with breakfast.   [DISCONTINUED] albuterol   (VENTOLIN  HFA) 108 (90 Base) MCG/ACT inhaler Inhale 1 puff into the lungs every 6 (six) hours as needed for wheezing or shortness of breath.   [DISCONTINUED] Albuterol -Budesonide  (AIRSUPRA ) 90-80 MCG/ACT AERO Inhale 2 puffs into the lungs every 6 (six) hours as needed (SOB/wheezing).   [DISCONTINUED] ALPRAZolam  (XANAX ) 0.25 MG tablet Take 1 tablet (0.25 mg total) by mouth daily as needed for anxiety.   [DISCONTINUED] buPROPion  (ZYBAN ) 150 MG 12 hr tablet Take 1 tablet (150 mg total) by mouth 2 (two) times daily.   [DISCONTINUED] lisinopril  (ZESTRIL ) 10 MG tablet Take 1 tablet (10 mg total) by mouth daily.   No facility-administered encounter medications on file as of 03/07/2024.    Surgical History: Past Surgical History:  Procedure Laterality Date   BREAST BIOPSY Left 10/09/2018   pending path   NO PAST SURGERIES      Medical History: Past Medical History:  Diagnosis Date   Anxiety    Hypercholesterolemia    Hypertension     Family History: Family History  Problem Relation Age of Onset   Lung cancer Father 41   Hypertension Mother    Heart disease Maternal Grandmother     Social History   Socioeconomic History   Marital status: Single    Spouse name: Not on file   Number of children: Not on file   Years of education: Not on file   Highest education level: Not on file  Occupational History   Not on file  Tobacco Use   Smoking status: Every Day  Current packs/day: 1.00    Types: Cigarettes   Smokeless tobacco: Never  Vaping Use   Vaping status: Never Used  Substance and Sexual Activity   Alcohol use: Not Currently    Comment: occ.   Drug use: No   Sexual activity: Not Currently    Birth control/protection: I.U.D.  Other Topics Concern   Not on file  Social History Narrative   Not on file   Social Drivers of Health   Financial Resource Strain: Not on file  Food Insecurity: No Food Insecurity (05/16/2023)   Hunger Vital Sign    Worried About Running Out  of Food in the Last Year: Never true    Ran Out of Food in the Last Year: Never true  Transportation Needs: No Transportation Needs (05/16/2023)   PRAPARE - Administrator, Civil Service (Medical): No    Lack of Transportation (Non-Medical): No  Physical Activity: Inactive (09/21/2017)   Exercise Vital Sign    Days of Exercise per Week: 0 days    Minutes of Exercise per Session: 0 min  Stress: No Stress Concern Present (09/21/2017)   [employer] of Occupational Health - Occupational Stress Questionnaire    Feeling of Stress : Only a little  Social Connections: Moderately Isolated (09/21/2017)   Social Connection and Isolation Panel    Frequency of Communication with Friends and Family: Three times a week    Frequency of Social Gatherings with Friends and Family: More than three times a week    Attends Religious Services: Never    Database administrator or Organizations: No    Attends Banker Meetings: Never    Marital Status: Never married  Intimate Partner Violence: Not At Risk (05/16/2023)   Humiliation, Afraid, Rape, and Kick questionnaire    Fear of Current or Ex-Partner: No    Emotionally Abused: No    Physically Abused: No    Sexually Abused: No      Review of Systems  Constitutional:  Positive for fatigue. Negative for activity change, appetite change and unexpected weight change.  HENT:  Negative for congestion and sinus pressure.   Respiratory: Negative.  Negative for cough, chest tightness, shortness of breath and wheezing.   Cardiovascular: Negative.  Negative for chest pain and palpitations.  Gastrointestinal: Negative.   Genitourinary: Negative.   Musculoskeletal:  Positive for arthralgias.  Neurological:  Negative for headaches.    Vital Signs: BP 132/76   Pulse 82   Temp 98.3 F (36.8 C)   Resp 16   Ht 5' 10 (1.778 m)   Wt 169 lb 12.8 oz (77 kg)   SpO2 99%   BMI 24.36 kg/m    Physical Exam Vitals reviewed.   Constitutional:      General: She is not in acute distress.    Appearance: Normal appearance. She is not ill-appearing.  HENT:     Head: Normocephalic and atraumatic.   Eyes:     Pupils: Pupils are equal, round, and reactive to light.    Cardiovascular:     Rate and Rhythm: Normal rate and regular rhythm.     Heart sounds: Normal heart sounds. No murmur heard. Pulmonary:     Effort: Pulmonary effort is normal. No respiratory distress.     Breath sounds: Normal breath sounds. No wheezing.   Neurological:     Mental Status: She is alert and oriented to person, place, and time.   Psychiatric:        Mood  and Affect: Mood normal.        Behavior: Behavior normal.        Assessment/Plan: 1. Essential hypertension, benign (Primary) Stable, continue lisinopril  as prescribed  - lisinopril  (ZESTRIL ) 10 MG tablet; Take 1 tablet (10 mg total) by mouth daily.  Dispense: 90 tablet; Refill: 1  2. Mixed hyperlipidemia Continue rosuvastatin  as prescribed.  3. Chronic obstructive pulmonary disease, unspecified COPD type (HCC) Continue prn albuterol  use as prescribed.  - albuterol  (VENTOLIN  HFA) 108 (90 Base) MCG/ACT inhaler; Inhale 1 puff into the lungs every 6 (six) hours as needed for wheezing or shortness of breath.  Dispense: 18 each; Refill: 5  4. Anxiety Continue prn alprazolam  as prescribed and continue venlafaxine  as prescribed.  - ALPRAZolam  (XANAX ) 0.25 MG tablet; Take 1 tablet (0.25 mg total) by mouth daily as needed for anxiety.  Dispense: 60 tablet; Refill: 2  5. Alcohol use disorder, moderate, in early remission (HCC) Continue abstinence from alcohol use. You are doing great! Keep up the good work!   General Counseling: kenzi bardwell understanding of the findings of todays visit and agrees with plan of treatment. I have discussed any further diagnostic evaluation that may be needed or ordered today. We also reviewed her medications today. she has been encouraged to  call the office with any questions or concerns that should arise related to todays visit.    No orders of the defined types were placed in this encounter.   Meds ordered this encounter  Medications   ALPRAZolam  (XANAX ) 0.25 MG tablet    Sig: Take 1 tablet (0.25 mg total) by mouth daily as needed for anxiety.    Dispense:  60 tablet    Refill:  2    For future refills   lisinopril  (ZESTRIL ) 10 MG tablet    Sig: Take 1 tablet (10 mg total) by mouth daily.    Dispense:  90 tablet    Refill:  1   albuterol  (VENTOLIN  HFA) 108 (90 Base) MCG/ACT inhaler    Sig: Inhale 1 puff into the lungs every 6 (six) hours as needed for wheezing or shortness of breath.    Dispense:  18 each    Refill:  5    Generic is ok    Return in about 3 months (around 05/28/2024) for F/U, anxiety med refill, Darilyn Storbeck PCP, can be virtual due to work schedule.   Total time spent:30 Minutes Time spent includes review of chart, medications, test results, and follow up plan with the patient.   Time Controlled Substance Database was reviewed by me.  This patient was seen by Mardy Maxin, FNP-C in collaboration with Dr. Sigrid Bathe as a part of collaborative care agreement.   Dean Goldner R. Maxin, MSN, FNP-C Internal medicine

## 2024-03-14 ENCOUNTER — Encounter: Payer: Self-pay | Admitting: Nurse Practitioner

## 2024-04-25 ENCOUNTER — Other Ambulatory Visit: Payer: Self-pay | Admitting: Nurse Practitioner

## 2024-04-25 DIAGNOSIS — F419 Anxiety disorder, unspecified: Secondary | ICD-10-CM

## 2024-05-29 ENCOUNTER — Telehealth: Payer: Self-pay

## 2024-05-29 ENCOUNTER — Telehealth: Admitting: Nurse Practitioner

## 2024-05-29 ENCOUNTER — Telehealth: Payer: Self-pay | Admitting: Nurse Practitioner

## 2024-05-29 NOTE — Telephone Encounter (Signed)
 Lvm and sent msg to r/s today's virtual appointment-Toni

## 2024-05-29 NOTE — Telephone Encounter (Signed)
 Call and left patient a message to give office a call so we can get her checked in for her visit.

## 2024-05-29 NOTE — Progress Notes (Deleted)
 [facility] [address] [place], [place] [address]  Internal MEDICINE  Telephone Visit  Patient Name: Sara Cummings  [id number]  [id number]  Date of Service: 05/29/2024  I connected with the patient at  by telephone and verified the patients identity using two identifiers.   I discussed the limitations, risks, security and privacy concerns of performing an evaluation and management service by telephone and the availability of in person appointments. I also discussed with the patient that there may be a patient responsible charge related to the service.  The patient expressed understanding and agrees to proceed.    Chief Complaint  Patient presents with   Telephone Screen   Telephone Assessment    HPI Sara Cummings presents for a telehealth virtual visit for   Current Medication: Outpatient Encounter Medications as of 05/29/2024  Medication Sig   albuterol  (VENTOLIN  HFA) 108 (90 Base) MCG/ACT inhaler Inhale 1 puff into the lungs every 6 (six) hours as needed for wheezing or shortness of breath.   ALPRAZolam  (XANAX ) 0.25 MG tablet Take 1 tablet (0.25 mg total) by mouth daily as needed for anxiety.   benzonatate  (TESSALON ) 200 MG capsule Take 1 capsule (200 mg total) by mouth 3 (three) times daily as needed for cough.   cyclobenzaprine  (FLEXERIL ) 5 MG tablet Take 1 tablet (5 mg total) by mouth 3 (three) times daily as needed for muscle spasms.   levonorgestrel  (MIRENA ) 20 MCG/24HR IUD 1 each by Intrauterine route once.   lisinopril  (ZESTRIL ) 10 MG tablet Take 1 tablet (10 mg total) by mouth daily.   rosuvastatin  (CRESTOR ) 20 MG tablet Take 1 tablet (20 mg total) by mouth daily.   venlafaxine  XR (EFFEXOR -XR) 75 MG 24 hr capsule Take 1 capsule (75 mg total) by mouth daily with breakfast.   No facility-administered encounter medications on file as of 05/29/2024.    Surgical History: Past Surgical History:  Procedure Laterality Date   BREAST BIOPSY Left 10/09/2018   pending  path   NO PAST SURGERIES      Medical History: Past Medical History:  Diagnosis Date   Anxiety    Hypercholesterolemia    Hypertension     Family History: Family History  Problem Relation Age of Onset   Lung cancer Father 27   Hypertension Mother    Heart disease Maternal Grandmother     Social History   Socioeconomic History   Marital status: Single    Spouse name: Not on file   Number of children: Not on file   Years of education: Not on file   Highest education level: Not on file  Occupational History   Not on file  Tobacco Use   Smoking status: Every Day    Current packs/day: 1.00    Types: Cigarettes   Smokeless tobacco: Never  Vaping Use   Vaping status: Never Used  Substance and Sexual Activity   Alcohol use: Not Currently    Comment: occ.   Drug use: No   Sexual activity: Not Currently    Birth control/protection: I.U.D.  Other Topics Concern   Not on file  Social History Narrative   Not on file   Social Drivers of Health   Financial Resource Strain: Not on file  Food Insecurity: No Food Insecurity (05/16/2023)   Hunger Vital Sign    Worried About Running Out of Food in the Last Year: Never true    Ran Out of Food in the Last Year: Never true  Transportation Needs: No Transportation Needs (  05/16/2023)   PRAPARE - Administrator, Civil Service (Medical): No    Lack of Transportation (Non-Medical): No  Physical Activity: Inactive (09/21/2017)   Exercise Vital Sign    Days of Exercise per Week: 0 days    Minutes of Exercise per Session: 0 min  Stress: No Stress Concern Present (09/21/2017)   Sara Cummings    Feeling of Stress : Only a little  Social Connections: Moderately Isolated (09/21/2017)   Social Connection and Isolation Panel    Frequency of Communication with Friends and Family: Three times a week    Frequency of Social Gatherings with Friends and Family: More than  three times a week    Attends Religious Services: Never    Database administrator or Organizations: No    Attends Banker Meetings: Never    Marital Status: Never married  Intimate Partner Violence: Not At Risk (05/16/2023)   Humiliation, Afraid, Rape, and Kick Cummings    Fear of Current or Ex-Partner: No    Emotionally Abused: No    Physically Abused: No    Sexually Abused: No      Review of Systems  Vital Signs: There were no vitals taken for this visit.   Observation/Objective:     Assessment/Plan:   General Counseling: Sara Cummings understanding of the findings of today's phone visit and agrees with plan of treatment. I have discussed any further diagnostic evaluation that may be needed or ordered today. We also reviewed her medications today. she has been encouraged to call the office with any questions or concerns that should arise related to todays visit.  No follow-ups on file.   No orders of the defined types were placed in this encounter.   No orders of the defined types were placed in this encounter.   Time spent:*** Minutes Time spent with patient included reviewing progress notes, labs, imaging studies, and discussing plan for follow up.   Controlled Substance Database was reviewed by me for overdose risk score (ORS) if appropriate.  This patient was seen by Mardy Maxin, FNP-C in collaboration with Dr. Sigrid Bathe as a part of collaborative care agreement.  Sara Keadle R. Maxin, MSN, FNP-C Internal medicine

## 2024-06-14 NOTE — Progress Notes (Signed)
 No show

## 2024-10-03 ENCOUNTER — Other Ambulatory Visit: Payer: Self-pay | Admitting: Nurse Practitioner

## 2024-10-03 DIAGNOSIS — I10 Essential (primary) hypertension: Secondary | ICD-10-CM
